# Patient Record
Sex: Female | Born: 1980
Health system: Southern US, Community
[De-identification: ages and names within clinical notes are randomized; demographics above are authoritative.]

## PROBLEM LIST (undated history)

## (undated) DIAGNOSIS — D649 Anemia, unspecified: Secondary | ICD-10-CM

## (undated) DIAGNOSIS — H35029 Exudative retinopathy, unspecified eye: Secondary | ICD-10-CM

## (undated) DIAGNOSIS — O24419 Gestational diabetes mellitus in pregnancy, unspecified control: Secondary | ICD-10-CM

## (undated) DIAGNOSIS — R87629 Unspecified abnormal cytological findings in specimens from vagina: Secondary | ICD-10-CM

## (undated) DIAGNOSIS — N159 Renal tubulo-interstitial disease, unspecified: Secondary | ICD-10-CM

## (undated) HISTORY — PX: EYE SURGERY: SHX253

## (undated) HISTORY — DX: Gestational diabetes mellitus in pregnancy, unspecified control: O24.419

## (undated) HISTORY — DX: Exudative retinopathy, unspecified eye: H35.029

---
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
## 2004-02-26 ENCOUNTER — Emergency Department (HOSPITAL_COMMUNITY): Admission: EM | Admit: 2004-02-26 | Discharge: 2004-02-26 | Payer: Self-pay | Admitting: Emergency Medicine

## 2004-07-17 ENCOUNTER — Ambulatory Visit (HOSPITAL_COMMUNITY): Admission: RE | Admit: 2004-07-17 | Discharge: 2004-07-17 | Payer: Self-pay | Admitting: Obstetrics & Gynecology

## 2004-08-04 ENCOUNTER — Inpatient Hospital Stay (HOSPITAL_COMMUNITY): Admission: AD | Admit: 2004-08-04 | Discharge: 2004-08-04 | Payer: Self-pay | Admitting: Obstetrics

## 2004-09-27 ENCOUNTER — Inpatient Hospital Stay (HOSPITAL_COMMUNITY): Admission: AD | Admit: 2004-09-27 | Discharge: 2004-09-27 | Payer: Self-pay | Admitting: Obstetrics

## 2004-10-30 ENCOUNTER — Inpatient Hospital Stay (HOSPITAL_COMMUNITY): Admission: AD | Admit: 2004-10-30 | Discharge: 2004-10-30 | Payer: Self-pay | Admitting: Obstetrics

## 2004-10-31 ENCOUNTER — Inpatient Hospital Stay (HOSPITAL_COMMUNITY): Admission: AD | Admit: 2004-10-31 | Discharge: 2004-10-31 | Payer: Self-pay | Admitting: Obstetrics

## 2004-11-02 ENCOUNTER — Inpatient Hospital Stay (HOSPITAL_COMMUNITY): Admission: AD | Admit: 2004-11-02 | Discharge: 2004-11-02 | Payer: Self-pay | Admitting: Obstetrics

## 2004-11-03 ENCOUNTER — Inpatient Hospital Stay (HOSPITAL_COMMUNITY): Admission: AD | Admit: 2004-11-03 | Discharge: 2004-11-05 | Payer: Self-pay | Admitting: Obstetrics

## 2004-11-11 ENCOUNTER — Inpatient Hospital Stay (HOSPITAL_COMMUNITY): Admission: AD | Admit: 2004-11-11 | Discharge: 2004-11-11 | Payer: Self-pay | Admitting: Obstetrics & Gynecology

## 2005-03-06 ENCOUNTER — Other Ambulatory Visit: Admission: RE | Admit: 2005-03-06 | Discharge: 2005-03-06 | Payer: Self-pay | Admitting: Family Medicine

## 2005-04-09 ENCOUNTER — Ambulatory Visit (HOSPITAL_COMMUNITY): Admission: RE | Admit: 2005-04-09 | Discharge: 2005-04-09 | Payer: Self-pay | Admitting: Chiropractic Medicine

## 2006-08-11 IMAGING — US US OB FOLLOW-UP
1 series · 13 of 27 positions shown · non-contrast
Comparison: none

CLINICAL DATA: 23-year-old female with 35 week 1 day gestation with pelvic pain, dyspnea.

[Series 1: us ob follow-up · 0.33mm/px · 13 of 27 slices shown]
[im 2/27]
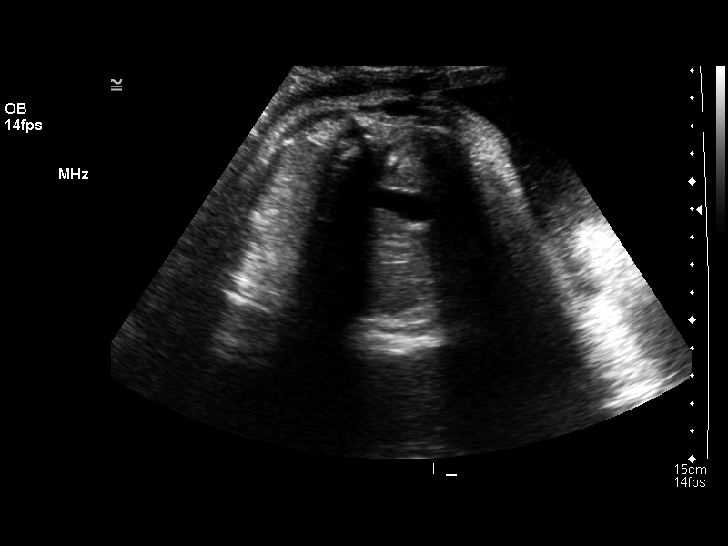
[im 4/27]
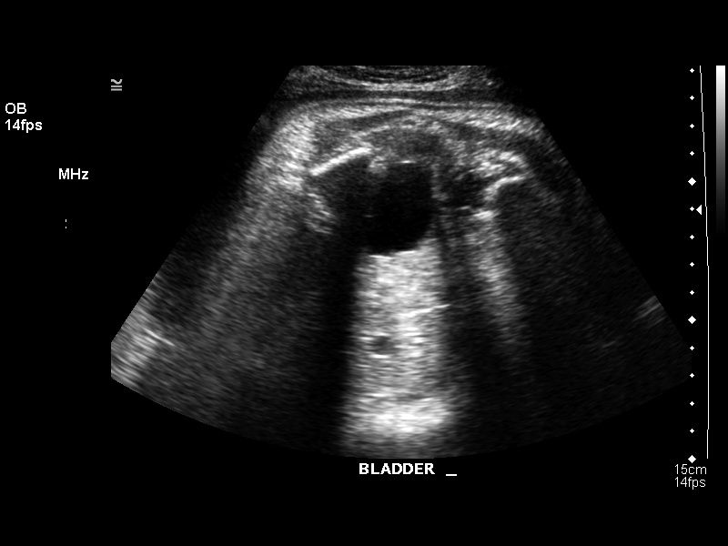
[im 6/27]
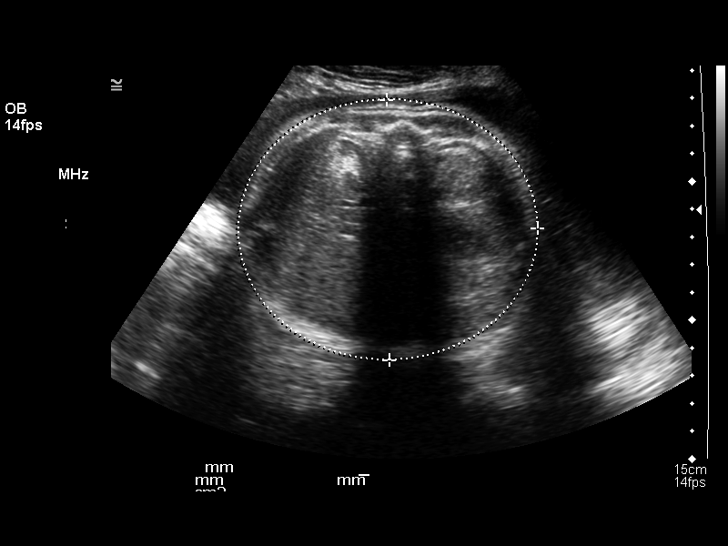
[im 8/27]
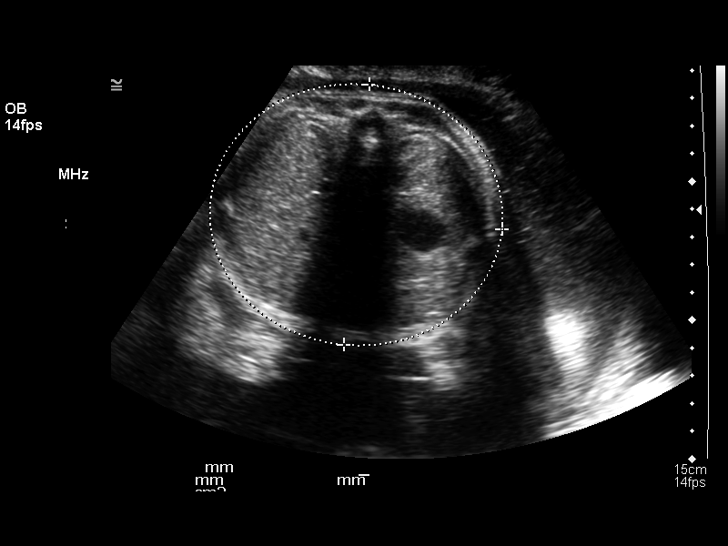
[im 10/27]
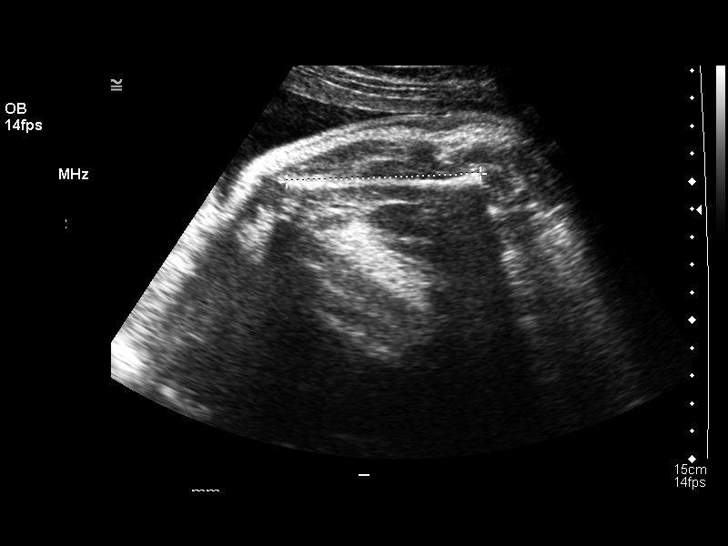
[im 12/27]
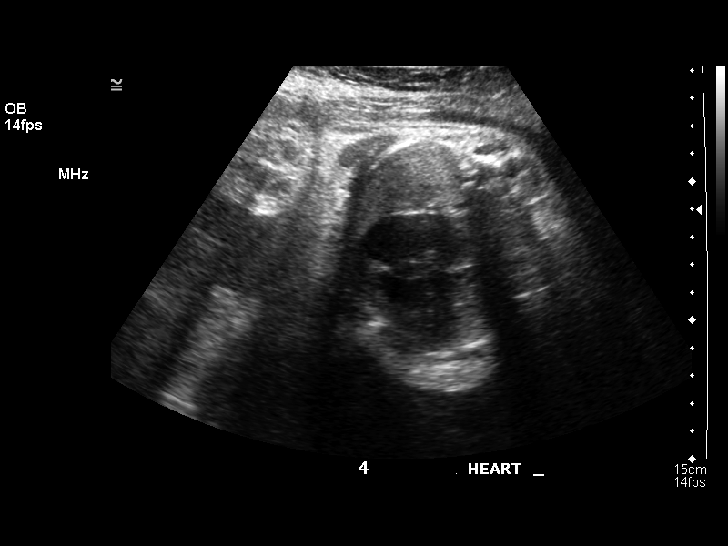
[im 14/27]
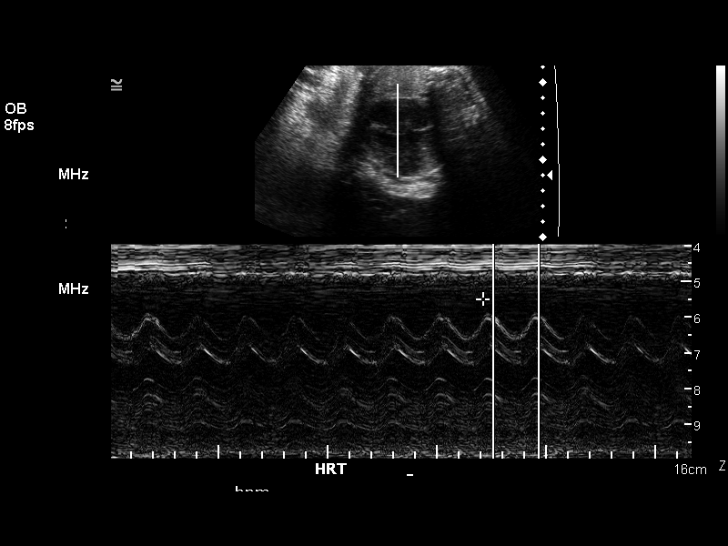
[im 16/27]
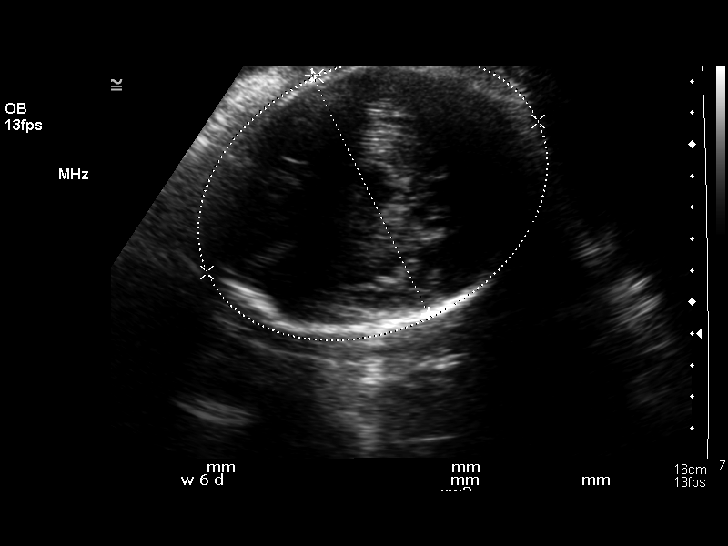
[im 18/27]
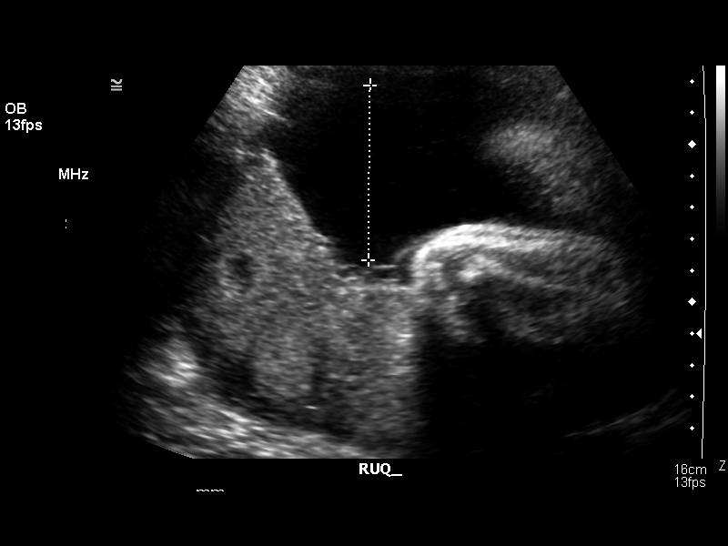
[im 20/27]
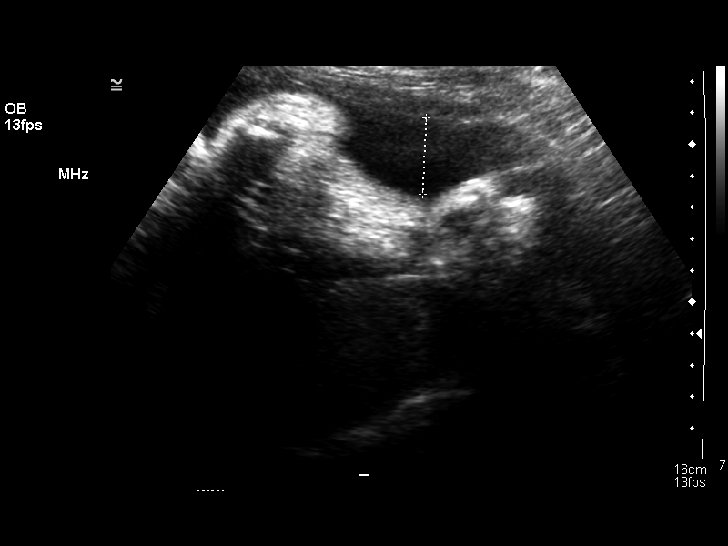
[im 22/27]
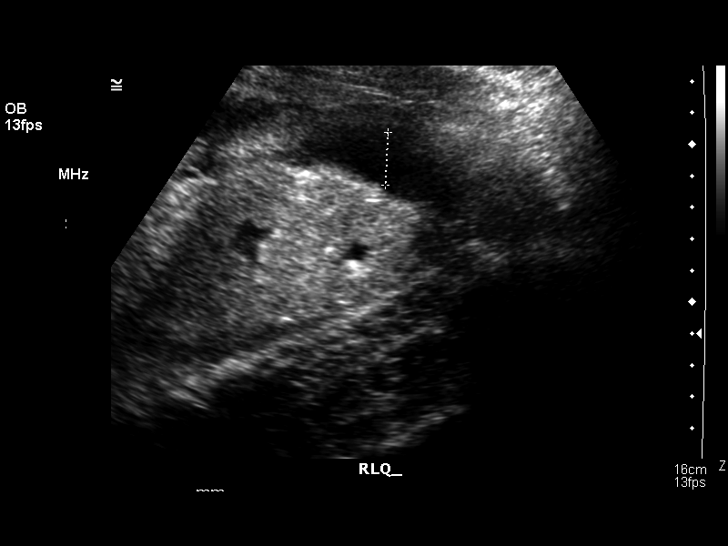
[im 24/27]
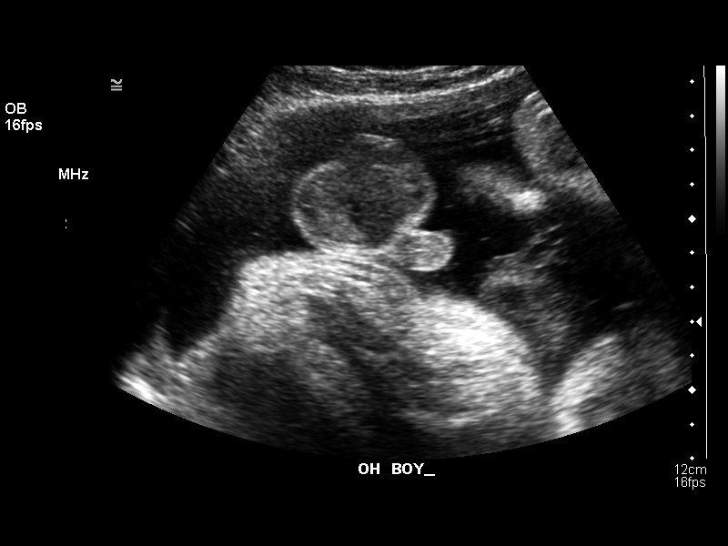
[im 26/27]
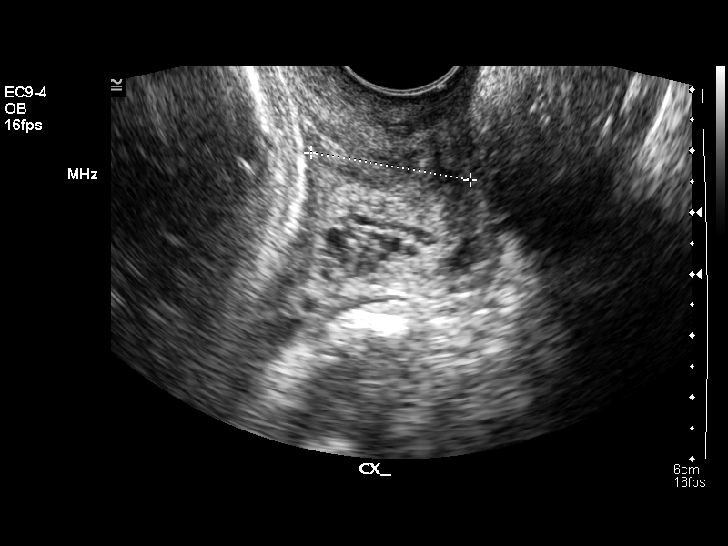

[13 of 27 positions shown; findings below may reference images not displayed]

OBSTETRICAL ULTRASOUND RE-EVALUATION WITH TRANSVAGINAL EVALUATION, 09/27/04:
 Number of Fetuses:  1
 Heart Rate:  144 bpm
 Movement:  Yes
 Breathing:  Yes
 Presentation:  Cephalic
 Placental Location:  Posterior
 Grade:  2
 Previa:  No
 Amniotic Fluid (subjective):  Normal
 Amniotic Fluid (objective):  AFI 11.8 cm (5th-45th %ile = 7.9 to 24.9 cm for 35 weeks) 

 FETAL BIOMETRY
 BPD:  8.4 cm, 34 W 0 D
 HC:  31.4 cm, 35 W 2 D
 AC:  31.5 cm, 35 W 3 D
 FL:  7.0 cm, 36 W 0 D

 Mean GA:  35 W 1 D
 Assigned GA:  34 W 6 D

 EFW:   9224 grams (H) 36th-93th %ile (4777 – 6560 g) for 35 weeks 

 FETAL ANATOMY
 Lateral Ventricles:    Previously seen 
 Thalami/CSP:    Previously seen 
 Posterior Fossa:    Previously seen 
 Nuchal Region:    Previously seen 
 Spine:    Previously seen 
 4 Chamber Heart on Left:  Visualized 
 Stomach on Left:  Visualized 
 3 Vessel Cord:  Visualized 
 Cord Insertion Site:  Previously seen   
 Kidneys:  Visualized 
 Bladder:  Visualized 
 Extremities:  Previously seen 

 Evaluation limited by: Fetal position and advanced gestational age.

 MATERNAL FINDINGS
 Cervix:  2.4 cm transvaginally.
IMPRESSION: 1.  Single living intrauterine gestation with both assigned and current ultrasound dating age of 35 weeks 1 day.  Cephalic presentation with normal amniotic fluid volume.
 2.  Fetal anatomy limited by fetal position and advanced gestational age.
 3.  Cervical length 2.4 cm transvaginally.

## 2007-07-08 ENCOUNTER — Emergency Department (HOSPITAL_COMMUNITY): Admission: EM | Admit: 2007-07-08 | Discharge: 2007-07-09 | Payer: Self-pay | Admitting: Emergency Medicine

## 2009-11-30 DIAGNOSIS — N159 Renal tubulo-interstitial disease, unspecified: Secondary | ICD-10-CM

## 2009-11-30 HISTORY — DX: Renal tubulo-interstitial disease, unspecified: N15.9

## 2010-07-10 ENCOUNTER — Emergency Department (HOSPITAL_COMMUNITY): Admission: EM | Admit: 2010-07-10 | Discharge: 2010-07-10 | Payer: Self-pay | Admitting: Emergency Medicine

## 2011-01-27 ENCOUNTER — Emergency Department (HOSPITAL_COMMUNITY)
Admission: EM | Admit: 2011-01-27 | Discharge: 2011-01-28 | Disposition: A | Discharge status: Home / Self Care | Payer: Self-pay | Attending: Emergency Medicine | Admitting: Emergency Medicine

## 2011-01-27 DIAGNOSIS — N12 Tubulo-interstitial nephritis, not specified as acute or chronic: Secondary | ICD-10-CM | POA: Insufficient documentation

## 2011-01-27 DIAGNOSIS — Z882 Allergy status to sulfonamides status: Secondary | ICD-10-CM | POA: Insufficient documentation

## 2011-01-27 DIAGNOSIS — Z880 Allergy status to penicillin: Secondary | ICD-10-CM | POA: Insufficient documentation

## 2011-01-27 LAB — URINALYSIS, ROUTINE W REFLEX MICROSCOPIC
Bilirubin Urine: NEGATIVE
Ketones, ur: NEGATIVE mg/dL
Nitrite: POSITIVE — AB
Protein, ur: 100 mg/dL — AB
Specific Gravity, Urine: 1.016 (ref 1.005–1.030)
Urine Glucose, Fasting: NEGATIVE mg/dL
Urobilinogen, UA: 1 mg/dL (ref 0.0–1.0)
pH: 7 (ref 5.0–8.0)

## 2011-01-27 LAB — URINE MICROSCOPIC-ADD ON

## 2011-01-27 LAB — CBC
HCT: 34.9 % — ABNORMAL LOW (ref 36.0–46.0)
Hemoglobin: 11.3 g/dL — ABNORMAL LOW (ref 12.0–15.0)
MCH: 28.2 pg (ref 26.0–34.0)
MCHC: 32.4 g/dL (ref 30.0–36.0)
MCV: 87 fL (ref 78.0–100.0)
Platelets: 242 10*3/uL (ref 150–400)
RBC: 4.01 MIL/uL (ref 3.87–5.11)
RDW: 13 % (ref 11.5–15.5)
WBC: 9.7 10*3/uL (ref 4.0–10.5)

## 2011-01-27 LAB — DIFFERENTIAL
Basophils Absolute: 0 10*3/uL (ref 0.0–0.1)
Basophils Relative: 0 % (ref 0–1)
Eosinophils Absolute: 0.1 10*3/uL (ref 0.0–0.7)
Eosinophils Relative: 1 % (ref 0–5)
Lymphocytes Relative: 15 % (ref 12–46)
Lymphs Abs: 1.4 10*3/uL (ref 0.7–4.0)
Monocytes Absolute: 1.3 10*3/uL — ABNORMAL HIGH (ref 0.1–1.0)
Monocytes Relative: 13 % — ABNORMAL HIGH (ref 3–12)
Neutro Abs: 6.9 10*3/uL (ref 1.7–7.7)
Neutrophils Relative %: 71 % (ref 43–77)

## 2011-01-27 LAB — PREGNANCY, URINE: Preg Test, Ur: NEGATIVE

## 2011-01-28 ENCOUNTER — Encounter (HOSPITAL_COMMUNITY): Payer: Self-pay

## 2011-01-28 ENCOUNTER — Emergency Department (HOSPITAL_COMMUNITY): Payer: Self-pay

## 2011-01-28 LAB — COMPREHENSIVE METABOLIC PANEL
ALT: 35 U/L (ref 0–35)
AST: 66 U/L — ABNORMAL HIGH (ref 0–37)
Albumin: 4 g/dL (ref 3.5–5.2)
Alkaline Phosphatase: 53 U/L (ref 39–117)
BUN: 8 mg/dL (ref 6–23)
CO2: 25 mEq/L (ref 19–32)
Calcium: 9.2 mg/dL (ref 8.4–10.5)
Chloride: 107 mEq/L (ref 96–112)
Creatinine, Ser: 0.82 mg/dL (ref 0.4–1.2)
GFR calc Af Amer: >60 mL/min (ref >60)
GFR calc non Af Amer: >60 mL/min (ref >60)
Glucose, Bld: 85 mg/dL (ref 70–99)
Potassium: 3.4 mEq/L — ABNORMAL LOW (ref 3.5–5.1)
Sodium: 139 mEq/L (ref 135–145)
Total Bilirubin: 0.8 mg/dL (ref 0.3–1.2)
Total Protein: 7.1 g/dL (ref 6.0–8.3)

## 2011-01-28 LAB — LIPASE, BLOOD: Lipase: 21 U/L (ref 11–59)

## 2011-01-29 LAB — URINE CULTURE
Colony Count: >=100000
Culture  Setup Time: 201202290349

## 2011-02-13 LAB — DIFFERENTIAL
Basophils Absolute: 0 10*3/uL (ref 0.0–0.1)
Basophils Relative: 1 % (ref 0–1)
Eosinophils Absolute: 0.1 10*3/uL (ref 0.0–0.7)
Eosinophils Relative: 1 % (ref 0–5)
Lymphocytes Relative: 7 % — ABNORMAL LOW (ref 12–46)
Lymphs Abs: 0.5 10*3/uL — ABNORMAL LOW (ref 0.7–4.0)
Monocytes Absolute: 0.8 10*3/uL (ref 0.1–1.0)
Monocytes Relative: 11 % (ref 3–12)
Neutro Abs: 5.7 10*3/uL (ref 1.7–7.7)
Neutrophils Relative %: 81 % — ABNORMAL HIGH (ref 43–77)

## 2011-02-13 LAB — URINALYSIS, ROUTINE W REFLEX MICROSCOPIC
Bilirubin Urine: NEGATIVE
Glucose, UA: NEGATIVE mg/dL
Hgb urine dipstick: NEGATIVE
Ketones, ur: NEGATIVE mg/dL
Nitrite: NEGATIVE
Protein, ur: NEGATIVE mg/dL
Specific Gravity, Urine: 1.021 (ref 1.005–1.030)
Urobilinogen, UA: 1 mg/dL (ref 0.0–1.0)
pH: 8 (ref 5.0–8.0)

## 2011-02-13 LAB — COMPREHENSIVE METABOLIC PANEL
ALT: 11 U/L (ref 0–35)
AST: 17 U/L (ref 0–37)
Albumin: 3.8 g/dL (ref 3.5–5.2)
Alkaline Phosphatase: 63 U/L (ref 39–117)
BUN: 11 mg/dL (ref 6–23)
CO2: 23 mEq/L (ref 19–32)
Calcium: 8.9 mg/dL (ref 8.4–10.5)
Chloride: 108 mEq/L (ref 96–112)
Creatinine, Ser: 0.75 mg/dL (ref 0.4–1.2)
GFR calc Af Amer: >60 mL/min (ref >60)
GFR calc non Af Amer: >60 mL/min (ref >60)
Glucose, Bld: 102 mg/dL — ABNORMAL HIGH (ref 70–99)
Potassium: 3.3 mEq/L — ABNORMAL LOW (ref 3.5–5.1)
Sodium: 135 mEq/L (ref 135–145)
Total Bilirubin: 0.3 mg/dL (ref 0.3–1.2)
Total Protein: 6.5 g/dL (ref 6.0–8.3)

## 2011-02-13 LAB — CBC
HCT: 32.9 % — ABNORMAL LOW (ref 36.0–46.0)
Hemoglobin: 11 g/dL — ABNORMAL LOW (ref 12.0–15.0)
MCH: 28.9 pg (ref 26.0–34.0)
MCHC: 33.3 g/dL (ref 30.0–36.0)
MCV: 86.7 fL (ref 78.0–100.0)
Platelets: 235 10*3/uL (ref 150–400)
RBC: 3.79 MIL/uL — ABNORMAL LOW (ref 3.87–5.11)
RDW: 14.6 % (ref 11.5–15.5)
WBC: 7 10*3/uL (ref 4.0–10.5)

## 2011-02-13 LAB — URINE CULTURE
Colony Count: 80000
Culture  Setup Time: 201108110842

## 2011-02-13 LAB — LIPASE, BLOOD: Lipase: 26 U/L (ref 11–59)

## 2011-02-13 LAB — URINE MICROSCOPIC-ADD ON

## 2011-02-13 LAB — POCT PREGNANCY, URINE: Preg Test, Ur: NEGATIVE

## 2011-03-29 ENCOUNTER — Inpatient Hospital Stay (HOSPITAL_COMMUNITY)
Admission: AD | Admit: 2011-03-29 | Discharge: 2011-03-29 | Disposition: A | Discharge status: Home / Self Care | Payer: Self-pay | Source: Ambulatory Visit | Attending: Obstetrics and Gynecology | Admitting: Obstetrics and Gynecology

## 2011-03-29 DIAGNOSIS — N938 Other specified abnormal uterine and vaginal bleeding: Secondary | ICD-10-CM | POA: Insufficient documentation

## 2011-03-29 DIAGNOSIS — N949 Unspecified condition associated with female genital organs and menstrual cycle: Secondary | ICD-10-CM

## 2011-03-29 DIAGNOSIS — N390 Urinary tract infection, site not specified: Secondary | ICD-10-CM | POA: Insufficient documentation

## 2011-03-29 LAB — URINALYSIS, ROUTINE W REFLEX MICROSCOPIC
Bilirubin Urine: NEGATIVE
Ketones, ur: NEGATIVE mg/dL
Nitrite: NEGATIVE
Specific Gravity, Urine: 1.025 (ref 1.005–1.030)
Urobilinogen, UA: 0.2 mg/dL (ref 0.0–1.0)

## 2011-03-29 LAB — WET PREP, GENITAL
Trich, Wet Prep: NONE SEEN
Yeast Wet Prep HPF POC: NONE SEEN

## 2011-03-29 LAB — URINE MICROSCOPIC-ADD ON

## 2011-03-29 LAB — HCG, QUANTITATIVE, PREGNANCY: hCG, Beta Chain, Quant, S: <2 m[IU]/mL (ref <5)

## 2012-02-04 ENCOUNTER — Emergency Department (HOSPITAL_BASED_OUTPATIENT_CLINIC_OR_DEPARTMENT_OTHER)
Admission: EM | Admit: 2012-02-04 | Discharge: 2012-02-04 | Disposition: A | Discharge status: Home Health | Payer: Self-pay | Attending: Emergency Medicine | Admitting: Emergency Medicine

## 2012-02-04 ENCOUNTER — Encounter (HOSPITAL_BASED_OUTPATIENT_CLINIC_OR_DEPARTMENT_OTHER): Payer: Self-pay | Admitting: *Deleted

## 2012-02-04 ENCOUNTER — Emergency Department (INDEPENDENT_AMBULATORY_CARE_PROVIDER_SITE_OTHER): Payer: Self-pay

## 2012-02-04 DIAGNOSIS — S161XXA Strain of muscle, fascia and tendon at neck level, initial encounter: Secondary | ICD-10-CM

## 2012-02-04 DIAGNOSIS — M542 Cervicalgia: Secondary | ICD-10-CM | POA: Insufficient documentation

## 2012-02-04 DIAGNOSIS — Z79899 Other long term (current) drug therapy: Secondary | ICD-10-CM | POA: Insufficient documentation

## 2012-02-04 DIAGNOSIS — M549 Dorsalgia, unspecified: Secondary | ICD-10-CM

## 2012-02-04 DIAGNOSIS — S139XXA Sprain of joints and ligaments of unspecified parts of neck, initial encounter: Secondary | ICD-10-CM | POA: Insufficient documentation

## 2012-02-04 DIAGNOSIS — M25519 Pain in unspecified shoulder: Secondary | ICD-10-CM

## 2012-02-04 DIAGNOSIS — X500XXA Overexertion from strenuous movement or load, initial encounter: Secondary | ICD-10-CM | POA: Insufficient documentation

## 2012-02-04 HISTORY — DX: Renal tubulo-interstitial disease, unspecified: N15.9

## 2012-02-04 HISTORY — DX: Anemia, unspecified: D64.9

## 2012-02-04 MED ORDER — IBUPROFEN 600 MG PO TABS: 600.0000 mg | ORAL_TABLET | Freq: Four times a day (QID) | ORAL | Status: AC | PRN

## 2012-02-04 MED ORDER — HYDROCODONE-ACETAMINOPHEN 5-325 MG PO TABS
1.0000 | ORAL_TABLET | Freq: Once | ORAL | Status: AC
  Administered 2012-02-04: 1 via ORAL
  Filled 2012-02-04: qty 1

## 2012-02-04 MED ORDER — IBUPROFEN 400 MG PO TABS
600.0000 mg | ORAL_TABLET | Freq: Once | ORAL | Status: AC
  Administered 2012-02-04: 600 mg via ORAL
  Filled 2012-02-04: qty 1

## 2012-02-04 MED ORDER — HYDROCODONE-ACETAMINOPHEN 5-500 MG PO TABS: 1.0000 | ORAL_TABLET | Freq: Four times a day (QID) | ORAL | Status: AC | PRN

## 2012-02-04 NOTE — Discharge Instructions (Signed)
Avoid heavy lifting more than 20 lbs for the next 3-4 days. May try heat/heating pad to sore area. May also try gently massage. Take motrin as need for pain. You may also take vicodin as need for pain. No driving for the next 6 hours or when taking vicodin. Also, do not take tylenol or acetaminophen containing medication when taking vicodin.  Follow up with primary care doctor in 1 week if symptoms fail to improve/resolve.    Return to ER iif worse, worsening/severe pain, arm numbness/weakess, other concern.       Cervical Strain Care After A cervical strain is when the muscles and ligaments in your neck have been stretched. The bones are not broken. If you had any problems moving your arms or legs immediately after the injury, even if the problem has gone away, make sure to tell this to your caregiver.  HOME CARE INSTRUCTIONS   While awake, apply ice packs to the neck or areas of pain about every 1 to 2 hours, for 15 to 20 minutes at a time. Do this for 2 days. If you were given a cervical collar for support, ask your caregiver if you may remove it for bathing or applying ice.   If given a cervical collar, wear as instructed. Do not remove any collar unless instructed by a caregiver.   Only take over-the-counter or prescription medicines for pain, discomfort, or fever as directed by your caregiver.  Recheck with the hospital or clinic after a radiologist has read your X-rays. Recheck with the hospital or clinic to make sure the initial readings are correct. Do this also to determine if you need further studies. It is your responsibility to find out your X-ray results. X-rays are sometimes repeated in one week to ten days. These are often repeated to make sure that a hairline fracture was not overlooked. Ask your caregiver how you are to find out about your radiology (X-ray) results. SEEK IMMEDIATE MEDICAL CARE IF:   You have increasing pain in your neck.   You develop difficulties  swallowing or breathing.   You have numbness, weakness, or movement problems in the arms or legs.   You have difficulty walking.   You develop bowel or bladder retention or incontinence.   You have problems with walking.  MAKE SURE YOU:   Understand these instructions.   Will watch your condition.   Will get help right away if you are not doing well or get worse.  Document Released: 11/16/2005 Document Revised: 07/29/2011 Document Reviewed: 06/29/2008 [facility] Patient Information 2012 [place], [place].      Cervical Sprain A cervical sprain is an injury in the neck in which the ligaments are stretched or torn. The ligaments are the tissues that hold the bones of the neck (vertebrae) in place.Cervical sprains can range from very mild to very severe. Most cervical sprains get better in 1 to 3 weeks, but it depends on the cause and extent of the injury. Severe cervical sprains can cause the neck vertebrae to be unstable. This can lead to damage of the spinal cord and can result in serious nervous system problems. Your caregiver will determine whether your cervical sprain is mild or severe. CAUSES  Severe cervical sprains may be caused by:  Contact sport injuries (football, rugby, wrestling, hockey, auto racing, gymnastics, diving, martial arts, boxing).   Motor vehicle collisions.   Whiplash injuries. This means the neck is forcefully whipped backward and forward.   Falls.  Mild cervical sprains may  be caused by:   Awkward positions, such as cradling a telephone between your ear and shoulder.   Sitting in a chair that does not offer proper support.   Working at a poorly Marketing executive station.   Activities that require looking up or down for long periods of time.  SYMPTOMS   Pain, soreness, stiffness, or a burning sensation in the front, back, or sides of the neck. This discomfort may develop immediately after injury or it may develop slowly and not begin for 24 hours or  more after an injury.   Pain or tenderness directly in the middle of the back of the neck.   Shoulder or upper back pain.   Limited ability to move the neck.   Headache.   Dizziness.   Weakness, numbness, or tingling in the hands or arms.   Muscle spasms.   Difficulty swallowing or chewing.   Tenderness and swelling of the neck.  DIAGNOSIS  Most of the time, your caregiver can diagnose this problem by taking your history and doing a physical exam. Your caregiver will ask about any known problems, such as arthritis in the neck or a previous neck injury. X-rays may be taken to find out if there are any other problems, such as problems with the bones of the neck. However, an X-ray often does not reveal the full extent of a cervical sprain. Other tests such as a computed tomography (CT) scan or magnetic resonance imaging (MRI) may be needed. TREATMENT  Treatment depends on the severity of the cervical sprain. Mild sprains can be treated with rest, keeping the neck in place (immobilization), and pain medicines. Severe cervical sprains need immediate immobilization and an appointment with an orthopedist or neurosurgeon. Several treatment options are available to help with pain, muscle spasms, and other symptoms. Your caregiver may prescribe:  Medicines, such as pain relievers, numbing medicines, or muscle relaxants.   Physical therapy. This can include stretching exercises, strengthening exercises, and posture training. Exercises and improved posture can help stabilize the neck, strengthen muscles, and help stop symptoms from returning.   A neck collar to be worn for short periods of time. Often, these collars are worn for comfort. However, certain collars may be worn to protect the neck and prevent further worsening of a serious cervical sprain.  HOME CARE INSTRUCTIONS   Put ice on the injured area.   Put ice in a plastic bag.   Place a towel between your skin and the bag.   Leave the  ice on for 15 to 20 minutes, 3 to 4 times a day.   Only take over-the-counter or prescription medicines for pain, discomfort, or fever as directed by your caregiver.   Keep all follow-up appointments as directed by your caregiver.   Keep all physical therapy appointments as directed by your caregiver.   If a neck collar is prescribed, wear it as directed by your caregiver.   Do not drive while wearing a neck collar.   Make any needed adjustments to your work station to promote good posture.   Avoid positions and activities that make your symptoms worse.   Warm up and stretch before being active to help prevent problems.  SEEK MEDICAL CARE IF:   Your pain is not controlled with medicine.   You are unable to decrease your pain medicine over time as planned.   Your activity level is not improving as expected.  SEEK IMMEDIATE MEDICAL CARE IF:   You develop any bleeding, stomach upset,  or signs of an allergic reaction to your medicine.   Your symptoms get worse.   You develop new, unexplained symptoms.   You have numbness, tingling, weakness, or paralysis in any part of your body.  MAKE SURE YOU:   Understand these instructions.   Will watch your condition.   Will get help right away if you are not doing well or get worse.  Document Released: 09/13/2007 Document Revised: 11/05/2011 Document Reviewed: 08/19/2011 [facility] Patient Information 2012 [place], [place].

## 2012-02-04 NOTE — ED Provider Notes (Signed)
History     CSN: [id number]  Arrival date & time 02/04/12  1805   First MD Initiated Contact with Patient 02/04/12 1845      Chief Complaint  Patient presents with  . Neck Pain    shoulders and upper back    (Consider location/radiation/quality/duration/timing/severity/associated sxs/prior treatment) Patient is a 31 y.o. female presenting with neck pain. The history is provided by the patient.  Neck Pain  Pertinent negatives include no chest pain, no numbness, no headaches and no weakness.  pt c/o neck pain s/p lifting injury 5 days ago. Constant, dull, neck and upper back pain. Occasionally radiates towards left arm. No numbness/weakness. No hx ddd. No fever/chills. No headache. No mid to lower back pain. Denies other injury. Worse w palpation, turning neck. Pt states injury occurred at work when lifting up pt from behind.   Past Medical History  Diagnosis Date  . Kidney infection 2011  . Anemia     Past Surgical History  Procedure Date  . Eye surgery     No family history on file.  History  Substance Use Topics  . Smoking status: Never Smoker   . Smokeless tobacco: Not on file  . Alcohol Use: No    OB History    Grav Para Term Preterm Abortions TAB SAB Ect Mult Living                  Review of Systems  Constitutional: Negative for fever and chills.  HENT: Positive for neck pain. Negative for sore throat.   Respiratory: Negative for shortness of breath.   Cardiovascular: Negative for chest pain.  Gastrointestinal: Negative for vomiting.  Genitourinary: Negative for flank pain.  Skin: Negative for rash.  Neurological: Negative for weakness, numbness and headaches.  Hematological: Does not bruise/bleed easily.  Psychiatric/Behavioral: Negative for confusion.    Allergies  Amoxicillin  Home Medications   Current Outpatient Rx  Name Route Sig Dispense Refill  . FERROUS FUMARATE 325 (106 FE) MG PO TABS Oral Take 1 tablet by mouth.    Marland Kitchen GINKGO 60 MG PO  TABS Oral Take 60 mg by mouth daily.     Marland Kitchen MEDROXYPROGESTERONE ACETATE 150 MG/ML IM SUSP Intramuscular Inject 150 mg into the muscle every 3 (three) months.      BP 123/72  Pulse 82  Temp(Src) 99 F (37.2 C) (Oral)  Resp 20  Ht 5\' 5"  (1.651 m)  Wt 163 lb (73.936 kg)  BMI 27.12 kg/m2  SpO2 100%  Physical Exam  Nursing note and vitals reviewed. Constitutional: She is oriented to person, place, and time. She appears well-developed and well-nourished. No distress.  HENT:  Head: Atraumatic.  Eyes: Conjunctivae are normal. No scleral icterus.  Neck: Normal range of motion. Neck supple. No tracheal deviation present.       No rigidity  Cardiovascular: Normal rate.   Pulmonary/Chest: Effort normal. No respiratory distress.  Abdominal: Normal appearance. She exhibits no distension.  Musculoskeletal: She exhibits no edema.       c spine tender, aligned, no step off. Remainder spine exam non tender. bil trap muscle tenderness.   Neurological: She is alert and oriented to person, place, and time.       Motor intact bil. Steady gait  Skin: Skin is warm and dry. No rash noted.  Psychiatric: She has a normal mood and affect.    ED Course  Procedures (including critical care time)     MDM  Pt has ride, did not  drive. No meds pta. vicodin 1 po. Motrin po. Berlin Hun, MD 02/07/12 0730

## 2012-02-04 NOTE — ED Notes (Signed)
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  Approximately 2 hours later developed severe neck, bilateral shoulder and upper back pain.  Patient states she bought a soft neck brace on Monday and has been wearing since.

## 2013-09-19 LAB — OB RESULTS CONSOLE HEPATITIS B SURFACE ANTIGEN: Hepatitis B Surface Ag: NEGATIVE

## 2013-09-19 LAB — OB RESULTS CONSOLE ABO/RH: RH TYPE: POSITIVE

## 2013-09-19 LAB — OB RESULTS CONSOLE ANTIBODY SCREEN: Antibody Screen: NEGATIVE

## 2013-09-19 LAB — OB RESULTS CONSOLE RUBELLA ANTIBODY, IGM: Rubella: IMMUNE

## 2013-09-19 LAB — OB RESULTS CONSOLE GC/CHLAMYDIA
Chlamydia: NEGATIVE
Gonorrhea: NEGATIVE

## 2013-09-19 LAB — OB RESULTS CONSOLE RPR: RPR: NONREACTIVE

## 2013-09-19 LAB — OB RESULTS CONSOLE HIV ANTIBODY (ROUTINE TESTING): HIV: NONREACTIVE

## 2013-11-30 NOTE — L&D Delivery Note (Signed)
Delivery Note At 6:36 AM a viable and healthy female was delivered via  (Presentation: Right Occiput; Anterior ).  APGAR: 9, 9; weight pending.   Placenta status: Spontaneous, Intact.  Cord: 3V   Anesthesia:  Epidural Episiotomy: None Lacerations: None Suture Repair: NA Est. Blood Loss (mL): 50 cc  Mom to postpartum.  Baby to Couplet care / Skin to Skin.  Yvette Moore. Yvette Moore 03/20/2014, 7:00 AM

## 2014-02-28 LAB — OB RESULTS CONSOLE GBS: GBS: NEGATIVE

## 2014-03-09 ENCOUNTER — Emergency Department (HOSPITAL_BASED_OUTPATIENT_CLINIC_OR_DEPARTMENT_OTHER)
Admission: EM | Admit: 2014-03-09 | Discharge: 2014-03-09 | Disposition: A | Discharge status: Home / Self Care | Payer: 59 | Attending: Emergency Medicine | Admitting: Emergency Medicine

## 2014-03-09 ENCOUNTER — Encounter (HOSPITAL_BASED_OUTPATIENT_CLINIC_OR_DEPARTMENT_OTHER): Payer: Self-pay | Admitting: Emergency Medicine

## 2014-03-09 DIAGNOSIS — Z880 Allergy status to penicillin: Secondary | ICD-10-CM | POA: Insufficient documentation

## 2014-03-09 DIAGNOSIS — Y93E1 Activity, personal bathing and showering: Secondary | ICD-10-CM | POA: Insufficient documentation

## 2014-03-09 DIAGNOSIS — Z79899 Other long term (current) drug therapy: Secondary | ICD-10-CM | POA: Insufficient documentation

## 2014-03-09 DIAGNOSIS — T148XXA Other injury of unspecified body region, initial encounter: Secondary | ICD-10-CM

## 2014-03-09 DIAGNOSIS — W010XXA Fall on same level from slipping, tripping and stumbling without subsequent striking against object, initial encounter: Secondary | ICD-10-CM | POA: Insufficient documentation

## 2014-03-09 DIAGNOSIS — S338XXA Sprain of other parts of lumbar spine and pelvis, initial encounter: Secondary | ICD-10-CM | POA: Insufficient documentation

## 2014-03-09 DIAGNOSIS — Y929 Unspecified place or not applicable: Secondary | ICD-10-CM | POA: Insufficient documentation

## 2014-03-09 DIAGNOSIS — O9989 Other specified diseases and conditions complicating pregnancy, childbirth and the puerperium: Secondary | ICD-10-CM | POA: Insufficient documentation

## 2014-03-09 LAB — URINALYSIS, ROUTINE W REFLEX MICROSCOPIC
BILIRUBIN URINE: NEGATIVE
Glucose, UA: 100 mg/dL — AB
Hgb urine dipstick: NEGATIVE
Ketones, ur: NEGATIVE mg/dL
NITRITE: NEGATIVE
PH: 7 (ref 5.0–8.0)
Protein, ur: NEGATIVE mg/dL
SPECIFIC GRAVITY, URINE: 1.011 (ref 1.005–1.030)
UROBILINOGEN UA: 1 mg/dL (ref 0.0–1.0)

## 2014-03-09 LAB — URINE MICROSCOPIC-ADD ON: RBC / HPF: NONE SEEN RBC/hpf (ref <3)

## 2014-03-09 NOTE — ED Notes (Signed)
Pt to room 14 in w/c. Pt reports slipping on wet floor while getting out of the shower and "jeked my legs in the wrong direction..." pt denies actual fall to ground, reports pain in her  Pelvic area, "it was like two wooden chopsticks being pulled apart!" pt states baby is moving, and has been more active since then, denies any cramping, bleeding, or discharge. Pt states "it just hurts to move..." pt assisted into gown and into stretcher x 3 max asssit.

## 2014-03-09 NOTE — ED Notes (Signed)
Junie Panning, rn, birthing suites, calls to ask pt to be turned onto her side (either side) and given cold water to drink. Pt drinks 140cc ice water and turns to her right side x 2 max assist, assisted to maintain position with pillows.

## 2014-03-09 NOTE — Progress Notes (Signed)
0958  Call to [facility] RN, Amy to notify of reactive FHR with irregular contractions.  Patient will be discharged home with instructions to see OB as scheduled and to call OB with any further problems.  Patient will be instructed to call OB if contractions become stronger of more frequent.

## 2014-03-09 NOTE — ED Notes (Signed)
Pt entered by erin, rn birthing suites.

## 2014-03-09 NOTE — Discharge Instructions (Signed)

## 2014-03-09 NOTE — ED Notes (Signed)
Ob rapid response nurse Lynd calls to inform this rn that pt and baby are stable and ok for discharge from ob standpoint. Dr. Karle Starch informed.

## 2014-03-09 NOTE — ED Notes (Signed)
Pt placed on toco and fetal monitor, fhr 143, birthing suite nurse notified by t. Masten, rn.

## 2014-03-09 NOTE — Progress Notes (Signed)
0910 Received call regarding this 33 yo G1 P0 @ 37.[redacted]wks GA with report of near fall yesterday.  Reported slipping on the way out of the shower, almost doing a split.

## 2014-03-09 NOTE — Progress Notes (Signed)
[facility] ED RN, Amy asked to reposition patient to side lying.

## 2014-03-09 NOTE — ED Provider Notes (Signed)
CSN: [id number]     Arrival date & time 03/09/14  8182 History   First MD Initiated Contact with Patient 03/09/14 0840     Chief Complaint  Patient presents with  . Fall  . 38 weeks preg      (Consider location/radiation/quality/duration/timing/severity/associated sxs/prior Treatment) Patient is a 33 y.o. female presenting with fall.  Fall   Pt is G2P1 at approx [redacted]wks gestation with a normal pregnancy so far reports she slipped in the shower yesterday doing 'a split' but not actually falling or striking her abdomen. She has had moderate to severe aching pelvic pain since then. No vaginal bleeding, leaking or cramping. She has had similar but less severe pain throughout the pregnancy. Did not call Ob about the fall.  Past Medical History  Diagnosis Date  . Kidney infection 2011  . Anemia    Past Surgical History  Procedure Laterality Date  . Eye surgery     History reviewed. No pertinent family history. History  Substance Use Topics  . Smoking status: Never Smoker   . Smokeless tobacco: Not on file  . Alcohol Use: No   OB History   Grav Para Term Preterm Abortions TAB SAB Ect Mult Living   1              Review of Systems All other systems reviewed and are negative except as noted in HPI.     Allergies  Amoxicillin  Home Medications   Current Outpatient Rx  Name  Route  Sig  Dispense  Refill  . Prenatal Vit-Fe Fumarate-FA (MULTIVITAMIN-PRENATAL) 27-0.8 MG TABS tablet   Oral   Take 1 tablet by mouth daily at 12 noon.         . ferrous fumarate (HEMOCYTE - 106 MG FE) 325 (106 FE) MG TABS   Oral   Take 1 tablet by mouth.         . Ginkgo 60 MG TABS   Oral   Take 60 mg by mouth daily.          . medroxyPROGESTERone (DEPO-PROVERA) 150 MG/ML injection   Intramuscular   Inject 150 mg into the muscle every 3 (three) months.          BP 126/73  Pulse 93  Temp(Src) 97.9 F (36.6 C) (Oral)  Resp 18  SpO2 99% Physical Exam  Nursing note and vitals  reviewed. Constitutional: She is oriented to person, place, and time. She appears well-developed and well-nourished.  HENT:  Head: Normocephalic and atraumatic.  Eyes: EOM are normal. Pupils are equal, round, and reactive to light.  Neck: Normal range of motion. Neck supple.  Cardiovascular: Normal rate, normal heart sounds and intact distal pulses.   Pulmonary/Chest: Effort normal and breath sounds normal.  Abdominal: Bowel sounds are normal. She exhibits no distension. There is no tenderness. There is no rebound and no guarding.  Gravid, consistent with dates  Musculoskeletal: Normal range of motion. She exhibits no edema and no tenderness.  Neurological: She is alert and oriented to person, place, and time. She has normal strength. No cranial nerve deficit or sensory deficit.  Skin: Skin is warm and dry. No rash noted.  Psychiatric: She has a normal mood and affect.    ED Course  Procedures (including critical care time) Labs Review Labs Reviewed  URINALYSIS, ROUTINE W REFLEX MICROSCOPIC   Imaging Review No results found.   EKG Interpretation None      MDM   Final diagnoses:  Muscle strain  Discussed with Dr. Rogue Bussing on call for patient's Ob Dr. Ouida Sills. She states typically needs monitoring 4-8hrs post injury, but given remote timing of this fall, a normal, reactive NST of 20-1min should be adequate. Will place patient on our fetal monitor.   10:57 AM Rhythm strip evaluated by staff at [facility], no concerning findings. Pt cleared for discharge. Advised APAP and rest for likely muscle strain from slipping on wet floor. Followup with Ob.   Ragan Reale B. Karle Starch, MD 03/09/14 1058

## 2014-03-10 LAB — URINE CULTURE: Colony Count: 50000

## 2014-03-18 ENCOUNTER — Inpatient Hospital Stay (HOSPITAL_COMMUNITY)
Admission: AD | Admit: 2014-03-18 | Discharge: 2014-03-18 | Disposition: A | Discharge status: Home / Self Care | Payer: 59 | Source: Ambulatory Visit | Attending: Obstetrics & Gynecology | Admitting: Obstetrics & Gynecology

## 2014-03-18 DIAGNOSIS — O9989 Other specified diseases and conditions complicating pregnancy, childbirth and the puerperium: Principal | ICD-10-CM

## 2014-03-18 DIAGNOSIS — O99891 Other specified diseases and conditions complicating pregnancy: Secondary | ICD-10-CM | POA: Insufficient documentation

## 2014-03-18 LAB — POCT FERN TEST: POCT Fern Test: NEGATIVE

## 2014-03-18 LAB — AMNISURE RUPTURE OF MEMBRANE (ROM) NOT AT ARMC: AMNISURE: NEGATIVE

## 2014-03-18 NOTE — MAU Provider Note (Signed)
HPI:  Ms. Yvette Moore is a 33 y.o. female G1P0 at [redacted]w[redacted]d who presents to MAU for possible ROM. At 0630 the patient felt a gush of fluid that leaked down her leg; at the time she was sleeping. The patient did not have a pad on upon arrival to MAU and has not continued to feel the fluid leaking. She reports good fetal movement, denies vaginal bleeding, vaginal itching/burning, urinary symptoms, h/a, dizziness, n/v, or fever/chills.    Objective:  GENERAL: Well-developed, well-nourished female in no acute distress.  HEENT: Normocephalic, atraumatic.   LUNGS: Effort normal HEART: Regular rate  SKIN: Warm, dry and without erythema PSYCH: Normal mood and affect  Filed Vitals:   03/18/14 0752  BP: 121/70  Pulse: 100  Temp: 97.7 F (36.5 C)  Resp: 18   Speculum exam: Vagina - Small amount of white, thick discharge, no odor. No pooling of fluid  Cervix - No contact bleeding, no active bleeding  Chaperone present for exam.  Fern negative  RN to consult with Dr. Alwyn Pea.  Negative pooling, negative fern slide  Darrelyn Hillock Theodus Ran, NP 03/18/2014 4:14 PM

## 2014-03-18 NOTE — MAU Note (Signed)
Pt presents with complaints of leakage of fluid since 6 this morning. She states the fluid is clear, denies vaginal bleeding.

## 2014-03-19 NOTE — MAU Provider Note (Signed)
Reviewed case with NP and I agree with above  Yvette Moore  

## 2014-03-20 ENCOUNTER — Inpatient Hospital Stay (HOSPITAL_COMMUNITY)
Admission: AD | Admit: 2014-03-20 | Discharge: 2014-03-21 | DRG: 775 | Disposition: A | Discharge status: Home / Self Care | Payer: 59 | Source: Ambulatory Visit | Attending: Obstetrics and Gynecology | Admitting: Obstetrics and Gynecology

## 2014-03-20 ENCOUNTER — Inpatient Hospital Stay (HOSPITAL_COMMUNITY): Payer: 59 | Admitting: Anesthesiology

## 2014-03-20 ENCOUNTER — Encounter (HOSPITAL_COMMUNITY): Payer: Self-pay | Admitting: *Deleted

## 2014-03-20 ENCOUNTER — Encounter (HOSPITAL_COMMUNITY): Payer: 59 | Admitting: Anesthesiology

## 2014-03-20 DIAGNOSIS — IMO0001 Reserved for inherently not codable concepts without codable children: Secondary | ICD-10-CM

## 2014-03-20 DIAGNOSIS — E669 Obesity, unspecified: Principal | ICD-10-CM | POA: Diagnosis present

## 2014-03-20 DIAGNOSIS — Z6841 Body Mass Index (BMI) 40.0 and over, adult: Secondary | ICD-10-CM

## 2014-03-20 DIAGNOSIS — O99214 Obesity complicating childbirth: Principal | ICD-10-CM

## 2014-03-20 DIAGNOSIS — Z87440 Personal history of urinary (tract) infections: Secondary | ICD-10-CM

## 2014-03-20 HISTORY — DX: Unspecified abnormal cytological findings in specimens from vagina: R87.629

## 2014-03-20 LAB — CBC
HCT: 38.9 % (ref 36.0–46.0)
Hemoglobin: 12.7 g/dL (ref 12.0–15.0)
MCH: 26.8 pg (ref 26.0–34.0)
MCHC: 32.6 g/dL (ref 30.0–36.0)
MCV: 82.1 fL (ref 78.0–100.0)
Platelets: 172 10*3/uL (ref 150–400)
RBC: 4.74 MIL/uL (ref 3.87–5.11)
RDW: 18.5 % — AB (ref 11.5–15.5)
WBC: 10.5 10*3/uL (ref 4.0–10.5)

## 2014-03-20 LAB — ABO/RH: ABO/RH(D): B POS

## 2014-03-20 LAB — TYPE AND SCREEN
ABO/RH(D): B POS
Antibody Screen: NEGATIVE

## 2014-03-20 LAB — POCT FERN TEST: POCT Fern Test: POSITIVE

## 2014-03-20 LAB — RPR

## 2014-03-20 MED ORDER — OXYCODONE-ACETAMINOPHEN 5-325 MG PO TABS
1.0000 | ORAL_TABLET | ORAL | Status: DC | PRN
  Administered 2014-03-20 – 2014-03-21 (×3): 1 via ORAL
  Filled 2014-03-20 (×4): qty 1

## 2014-03-20 MED ORDER — PRENATAL MULTIVITAMIN CH
1.0000 | ORAL_TABLET | Freq: Every day | ORAL | Status: DC
  Administered 2014-03-20 – 2014-03-21 (×2): 1 via ORAL
  Filled 2014-03-20 (×2): qty 1

## 2014-03-20 MED ORDER — DIPHENHYDRAMINE HCL 50 MG/ML IJ SOLN: 12.5000 mg | INTRAMUSCULAR | Status: DC | PRN

## 2014-03-20 MED ORDER — TETANUS-DIPHTH-ACELL PERTUSSIS 5-2.5-18.5 LF-MCG/0.5 IM SUSP
0.5000 mL | Freq: Once | INTRAMUSCULAR | Status: AC
  Administered 2014-03-21: 0.5 mL via INTRAMUSCULAR
  Filled 2014-03-20: qty 0.5

## 2014-03-20 MED ORDER — ZOLPIDEM TARTRATE 5 MG PO TABS: 5.0000 mg | ORAL_TABLET | Freq: Every evening | ORAL | Status: DC | PRN

## 2014-03-20 MED ORDER — SODIUM BICARBONATE 8.4 % IV SOLN
INTRAVENOUS | Status: DC | PRN
  Administered 2014-03-20: 5 mL via EPIDURAL

## 2014-03-20 MED ORDER — BENZOCAINE-MENTHOL 20-0.5 % EX AERO
1.0000 "application " | INHALATION_SPRAY | CUTANEOUS | Status: DC | PRN
  Administered 2014-03-20: 1 via TOPICAL
  Filled 2014-03-20: qty 56

## 2014-03-20 MED ORDER — ONDANSETRON HCL 4 MG/2ML IJ SOLN: 4.0000 mg | Freq: Four times a day (QID) | INTRAMUSCULAR | Status: DC | PRN

## 2014-03-20 MED ORDER — OXYTOCIN BOLUS FROM INFUSION
500.0000 mL | INTRAVENOUS | Status: DC
  Administered 2014-03-20: 500 mL via INTRAVENOUS

## 2014-03-20 MED ORDER — PHENYLEPHRINE 40 MCG/ML (10ML) SYRINGE FOR IV PUSH (FOR BLOOD PRESSURE SUPPORT)
80.0000 ug | PREFILLED_SYRINGE | INTRAVENOUS | Status: DC | PRN
  Filled 2014-03-20: qty 2

## 2014-03-20 MED ORDER — LIDOCAINE HCL (PF) 1 % IJ SOLN
30.0000 mL | INTRAMUSCULAR | Status: DC | PRN
  Filled 2014-03-20: qty 30

## 2014-03-20 MED ORDER — DIBUCAINE 1 % RE OINT: 1.0000 "application " | TOPICAL_OINTMENT | RECTAL | Status: DC | PRN

## 2014-03-20 MED ORDER — SENNOSIDES-DOCUSATE SODIUM 8.6-50 MG PO TABS
2.0000 | ORAL_TABLET | ORAL | Status: DC
  Administered 2014-03-21: 2 via ORAL
  Filled 2014-03-20: qty 2

## 2014-03-20 MED ORDER — FLEET ENEMA 7-19 GM/118ML RE ENEM: 1.0000 | ENEMA | RECTAL | Status: DC | PRN

## 2014-03-20 MED ORDER — DIPHENHYDRAMINE HCL 25 MG PO CAPS: 25.0000 mg | ORAL_CAPSULE | Freq: Four times a day (QID) | ORAL | Status: DC | PRN

## 2014-03-20 MED ORDER — PHENYLEPHRINE 40 MCG/ML (10ML) SYRINGE FOR IV PUSH (FOR BLOOD PRESSURE SUPPORT)
80.0000 ug | PREFILLED_SYRINGE | INTRAVENOUS | Status: DC | PRN
Start: 2014-03-20 — End: 2014-03-20
  Filled 2014-03-20: qty 2
  Filled 2014-03-20: qty 10

## 2014-03-20 MED ORDER — SIMETHICONE 80 MG PO CHEW: 80.0000 mg | CHEWABLE_TABLET | ORAL | Status: DC | PRN

## 2014-03-20 MED ORDER — EPHEDRINE 5 MG/ML INJ
10.0000 mg | INTRAVENOUS | Status: DC | PRN
  Filled 2014-03-20: qty 2

## 2014-03-20 MED ORDER — IBUPROFEN 600 MG PO TABS
600.0000 mg | ORAL_TABLET | Freq: Four times a day (QID) | ORAL | Status: DC
  Administered 2014-03-20 – 2014-03-21 (×6): 600 mg via ORAL
  Filled 2014-03-20 (×6): qty 1

## 2014-03-20 MED ORDER — ONDANSETRON HCL 4 MG PO TABS: 4.0000 mg | ORAL_TABLET | ORAL | Status: DC | PRN

## 2014-03-20 MED ORDER — LANOLIN HYDROUS EX OINT: TOPICAL_OINTMENT | CUTANEOUS | Status: DC | PRN

## 2014-03-20 MED ORDER — OXYCODONE-ACETAMINOPHEN 5-325 MG PO TABS: 1.0000 | ORAL_TABLET | ORAL | Status: DC | PRN

## 2014-03-20 MED ORDER — LACTATED RINGERS IV SOLN: 500.0000 mL | INTRAVENOUS | Status: DC | PRN

## 2014-03-20 MED ORDER — FENTANYL 2.5 MCG/ML BUPIVACAINE 1/10 % EPIDURAL INFUSION (WH - ANES)
14.0000 mL/h | INTRAMUSCULAR | Status: DC | PRN
  Administered 2014-03-20: 14 mL/h via EPIDURAL
  Filled 2014-03-20: qty 125

## 2014-03-20 MED ORDER — IBUPROFEN 600 MG PO TABS: 600.0000 mg | ORAL_TABLET | Freq: Four times a day (QID) | ORAL | Status: DC | PRN

## 2014-03-20 MED ORDER — ACETAMINOPHEN 325 MG PO TABS: 650.0000 mg | ORAL_TABLET | ORAL | Status: DC | PRN

## 2014-03-20 MED ORDER — EPHEDRINE 5 MG/ML INJ
10.0000 mg | INTRAVENOUS | Status: DC | PRN
  Filled 2014-03-20: qty 2
  Filled 2014-03-20: qty 4

## 2014-03-20 MED ORDER — WITCH HAZEL-GLYCERIN EX PADS
1.0000 | MEDICATED_PAD | CUTANEOUS | Status: DC | PRN
Start: 2014-03-20 — End: 2014-03-21

## 2014-03-20 MED ORDER — LACTATED RINGERS IV SOLN: 500.0000 mL | Freq: Once | INTRAVENOUS | Status: DC

## 2014-03-20 MED ORDER — LACTATED RINGERS IV SOLN
INTRAVENOUS | Status: DC
  Administered 2014-03-20: 03:00:00 via INTRAVENOUS

## 2014-03-20 MED ORDER — METHYLERGONOVINE MALEATE 0.2 MG/ML IJ SOLN: 0.2000 mg | INTRAMUSCULAR | Status: DC | PRN

## 2014-03-20 MED ORDER — OXYTOCIN 40 UNITS IN LACTATED RINGERS INFUSION - SIMPLE MED
62.5000 mL/h | INTRAVENOUS | Status: DC
  Filled 2014-03-20: qty 1000

## 2014-03-20 MED ORDER — ONDANSETRON HCL 4 MG/2ML IJ SOLN: 4.0000 mg | INTRAMUSCULAR | Status: DC | PRN

## 2014-03-20 MED ORDER — CITRIC ACID-SODIUM CITRATE 334-500 MG/5ML PO SOLN: 30.0000 mL | ORAL | Status: DC | PRN

## 2014-03-20 MED ORDER — METHYLERGONOVINE MALEATE 0.2 MG PO TABS: 0.2000 mg | ORAL_TABLET | ORAL | Status: DC | PRN

## 2014-03-20 NOTE — MAU Note (Signed)
Contractions every 2-3 minutes. One episode of fluid running down her legs today. Denies vaginal bleeding.

## 2014-03-20 NOTE — Anesthesia Postprocedure Evaluation (Signed)
  Anesthesia Post-op Note  Patient: Yvette Moore  Procedure(s) Performed: * No procedures listed *  Patient Location: PACU and Mother/Baby  Anesthesia Type:Epidural  Level of Consciousness: awake, alert  and oriented  Airway and Oxygen Therapy: Patient Spontanous Breathing  Post-op Pain: none  Post-op Assessment: Post-op Vital signs reviewed, Patient's Cardiovascular Status Stable, No headache, No backache, No residual numbness and No residual motor weakness  Post-op Vital Signs: Reviewed and stable  Complications: No apparent anesthesia complications

## 2014-03-20 NOTE — Anesthesia Procedure Notes (Signed)

## 2014-03-20 NOTE — Anesthesia Preprocedure Evaluation (Signed)

## 2014-03-20 NOTE — Lactation Note (Signed)
This note was copied from the chart of [place]. Lactation Consultation Note Follow up visit at 14 hours of age.  Mom reports baby is still sleepy, a little spity and has not latched since delivery. Baby asleep in crib, woke baby and placed STS with mom in football hold.  Attempted hand expression no colostrum visible at this time, but mom does return demonstration well.  Baby offered gloved finger for suck training.  Baby is disorganized with tight mouth, biting and tongue thrusting.  No latch achieved, baby remains STS with mom.  Encouraged to wake baby every 3 hours for feedings and feed with early hunger cues.  Report given to [facility] RN.     Patient Name: Yvette Moore [id number] Date: 03/20/2014 Reason for consult: Follow-up assessment;Difficult latch   Maternal Data Has patient been taught Hand Expression?: Yes Does the patient have breastfeeding experience prior to this delivery?: No (older son didn't latch)  Feeding Feeding Type: Breast Fed Length of feed:  (no suckling)  LATCH Score/Interventions Latch: Too sleepy or reluctant, no latch achieved, no sucking elicited.  Audible Swallowing: None  Type of Nipple: Everted at rest and after stimulation  Comfort (Breast/Nipple): Soft / non-tender     Hold (Positioning): No assistance needed to correctly position infant at breast. Intervention(s): Breastfeeding basics reviewed;Support Pillows;Position options;Skin to skin  LATCH Score: 6  Lactation Tools Discussed/Used     Consult Status Consult Status: Follow-up Date: 03/21/14 Follow-up type: In-patient    Justine Null Abrham Maslowski 03/20/2014, 8:56 PM

## 2014-03-20 NOTE — H&P (Signed)
Yvette Moore is a 33 y.o. female presenting for labor  33 yo G3P1011 @ 39+0 presents for painful contractions and leaking fluid. Pt confirmed to be in labor. Pregnancy has been uncomplicated History OB History   Grav Para Term Preterm Abortions TAB SAB Ect Mult Living   3 1 1  1  1         Past Medical History  Diagnosis Date  . Kidney infection 2011  . Anemia   . Vaginal Pap smear, abnormal    Past Surgical History  Procedure Laterality Date  . Eye surgery    . Leep  2002   Family History: family history is not on file. Social History:  reports that she has never smoked. She does not have any smokeless tobacco history on file. She reports that she does not drink alcohol or use illicit drugs.   Prenatal Transfer Tool  Maternal Diabetes: No, elevated 1 hr, normal 3 hr Genetic Screening: Normal Maternal Ultrasounds/Referrals: Normal Fetal Ultrasounds or other Referrals:  None Maternal Substance Abuse:  No Significant Maternal Medications:  None Significant Maternal Lab Results:  None Other Comments:  None  ROS: as above  Dilation: 6.5 Effacement (%): 100 Station: -1;0 Exam by:: Lamonte Sakai, RN Blood pressure 133/77, pulse 92, temperature 98.1 F (36.7 C), temperature source Oral, resp. rate 20, SpO2 100.00%. Exam Physical Exam  Prenatal labs: ABO, Rh: B/Positive/-- (10/21 0000) Antibody: Negative (10/21 0000) Rubella: Immune (10/21 0000) RPR: Nonreactive (10/21 0000)  HBsAg: Negative (10/21 0000)  HIV: Non-reactive (10/21 0000)  GBS: Negative (04/01 0000)   Assessment/Plan: 1) Admit 2) Epidural on request   Tonda Wiederhold H. Chamaine Stankus 03/20/2014, 4:08 AM

## 2014-03-20 NOTE — MAU Note (Signed)
PT SAYS UC Q 5 MIN.   WAS 2 CM IN OFFICE.  DENIES HSV AND MRSA.

## 2014-03-20 NOTE — MAU Provider Note (Signed)
S: Yvette Moore is a 33 y.o. G2P1 at [redacted]w[redacted]d who presents today with contractions and leaking of fluid. She states that she has been having contractions off and on for a couple days. At her most recent cervical check she states that she was 2cm. She denies any bleeding, but does report that she had "fluid running down her leg" earlier today. She states that the baby has been moving normally.   O: VSS, afebrile External: no lesion Vagina: pooling of clear mucous, ?fluid Cervix: pink, smooth, 3-4/100/-1/vtx/BBOW  Uterus: AGA FHT: 140, moderate with accels, no decels. Cat I Toco: q 4 mins Fern: positive   A/P: 33 y.o. G2P1 at [redacted]w[redacted]d  Reassuring M-F status RN to report with attending MD for plan of care  Mathis Bud

## 2014-03-21 LAB — CBC
HCT: 39.2 % (ref 36.0–46.0)
HEMOGLOBIN: 12.4 g/dL (ref 12.0–15.0)
MCH: 26.4 pg (ref 26.0–34.0)
MCHC: 31.6 g/dL (ref 30.0–36.0)
MCV: 83.4 fL (ref 78.0–100.0)
Platelets: 170 10*3/uL (ref 150–400)
RBC: 4.7 MIL/uL (ref 3.87–5.11)
RDW: 18.8 % — ABNORMAL HIGH (ref 11.5–15.5)
WBC: 11.1 10*3/uL — ABNORMAL HIGH (ref 4.0–10.5)

## 2014-03-21 MED ORDER — OXYCODONE-ACETAMINOPHEN 5-325 MG PO TABS: 1.0000 | ORAL_TABLET | ORAL | Status: DC | PRN

## 2014-03-21 NOTE — Discharge Summary (Signed)
Obstetric Discharge Summary Reason for Admission: onset of labor and rupture of membranes Prenatal Procedures: ultrasound Intrapartum Procedures: spontaneous vaginal delivery Postpartum Procedures: none Complications-Operative and Postpartum: none Hemoglobin  Date Value Ref Range Status  03/21/2014 12.4  12.0 - 15.0 g/dL Final     HCT  Date Value Ref Range Status  03/21/2014 39.2  36.0 - 46.0 % Final    Physical Exam:  General: alert Lochia: appropriate Uterine Fundus: firm  Discharge Diagnoses: Term Pregnancy-delivered  Discharge Information: Date: 03/21/2014 Activity: pelvic rest Diet: routine Medications: PNV, Ibuprofen and Percocet Condition: stable Instructions: refer to practice specific booklet Discharge to: home Follow-up Information   Follow up with [name]., MD In 4 weeks.   Specialty:  Obstetrics and Gynecology   Contact information:   [place] [id number] [phone]       Newborn Data: Live born female  Birth Weight: 7 lb 13 oz (3545 g) APGAR: 9, 9  Home with mother.  Yvette Moore 03/21/2014, 7:47 AM

## 2014-03-21 NOTE — Progress Notes (Signed)
PPD#1 Pt is doing well. Would like to go home. Will circ baby this am VSSAF IMP/ stable Plan/ Will discharge to home.

## 2014-03-22 ENCOUNTER — Ambulatory Visit: Payer: Self-pay

## 2014-03-22 NOTE — Lactation Note (Signed)
This note was copied from the chart of [place]. Lactation Consultation Note  Patient Name: Yvette Moore [id number] Date: 03/22/2014 Reason for consult: Follow-up assessment;Breast/nipple pain;Difficult latch, with pain too severe on (R) side to latch baby, per Mom.  LC called to assist with latch to (L) and mom has baby in sidelying football position but states baby is not opening mouth wide enough.  FOB has prepared the formula in curved-tip syringe.  LC demonstrated how to drip drops of formula on tip of baby's tongue and touch his lips to encourage him to open wider.  LC able to assist with deeper latch and mom reports no pain but after 5 minutes, he slips down to nipple and needs re-latching.  Second latch is easier and rhythmical sucking observed for additional 5 minutes.  FOB shown how to assist and stimulate baby to suck as well as how to compress mom's breast to enhance letdown and ensure that baby sustains deep latch.  LC reviewed feeding and pumping plan per previous [place] visit today.   Maternal Data    Feeding Feeding Type: Breast Fed  LATCH Score/Interventions Latch: Repeated attempts needed to sustain latch, nipple held in mouth throughout feeding, stimulation needed to elicit sucking reflex. (needs repeated attempts, chin tug and drops of formula on tongue) Intervention(s): Skin to skin;Teach feeding cues;Waking techniques Intervention(s): Adjust position;Assist with latch  Audible Swallowing: A few with stimulation (increased after second deeper latch) Intervention(s): Skin to skin;Hand expression Intervention(s): Skin to skin;Hand expression;Alternate breast massage  Type of Nipple: Everted at rest and after stimulation  Comfort (Breast/Nipple): Engorged, cracked, bleeding, large blisters, severe discomfort Problem noted: Cracked, bleeding, blisters, bruises Intervention(s): Expressed breast milk to nipple;Double electric pump     Hold (Positioning): Assistance  needed to correctly position infant at breast and maintain latch. (FOB shown how to assist and is able to per form chin tug) Intervention(s): Position options;Skin to skin;Support Pillows;Breastfeeding basics reviewed  LATCH Score: 5  Lactation Tools Discussed/Used Tools: Nipple Shields;Comfort gels (reinforced plan by previous LC) Nipple shield size: 24 STS, chin tug and stimulation techniques Breast compression  Consult Status Consult Status: Follow-up Date: 03/23/14 Follow-up type: In-patient    Landis Gandy 03/22/2014, 5:32 PM

## 2014-03-23 ENCOUNTER — Ambulatory Visit: Payer: Self-pay

## 2014-03-23 NOTE — Lactation Note (Signed)
This note was copied from the chart of [place]. Lactation Consultation Note  Patient Name: Yvette Moore Date: 03/23/2014   FOB recently fed baby and baby asleep. Mom is wanting to go home today, Peds plans to start double phototherapy and draw labs at 12:00 today, then decide if baby can go home on photo therapy. Mom is latching baby using nipple shield and reports this has decreased her nipple pain. Did not see latch at this visit. Mom is also post pumping to increase milk production. Breasts are beginning to fill. Parents have been supplementing with small amounts of mostly formula. Mom does not have DEBP for home use but is registered with [facility]. LC advised Mom to call [facility] to see if she can get a pump, [facility] referral completed by [facility] and faxed to [facility] office. [place] left phone number for Mom to call with next feeding so LC can observe latch with nipple shield.    Maternal Data    Feeding Feeding Type: Formula  [facility] Score/Interventions                      Lactation Tools Discussed/Used     Consult Status      Yvette Moore 03/23/2014, 2:12 PM

## 2014-03-23 NOTE — Lactation Note (Signed)
This note was copied from the chart of [place]. Lactation Consultation Note  Patient Name: Yvette Moore [id number] Date: 03/23/2014 Reason for consult: Follow-up assessment;Breast/nipple pain;Difficult latch;Infant weight loss;Hyperbilirubinemia Baby on double photo therapy. Mom did not know she could BF with photo therapy, FOB recently gave baby small amount of formula via curved tipped syringe. Assisted Mom with waking baby and latching in football hold. At the beginning of the feeding Mom was using #24 nipple shield and baby was struggling to organize his suck and latch. Changed nipple shield to size 20 and after few attempts baby latched and nursed demonstrating a good rhythmic suck with audible swallows for 25 minutes. Breast milk present in the nipple shield at the end of the feeding, Mom's nipple was round and Mom denied discomfort with baby at the breast. Due to weight loss and jaundice advised Mom to BF every 2-3 hours, keep baby nursing for 15-20 minutes, both breasts when possible. Post pump for 15 minutes to protect milk supply, supplement with 30 ml of EBM or formula after each feeding during the day, starting using bottle with slow flow nipple to supplement.  Mom unable to get to [facility] today, will do [facility] loaner. Baby will go home on double phototherapy. Monitor voids/stools. Engorgement care reviewed if needed. OP f/u scheduled for Thursday, 03/29/14 at 2:30.   Maternal Data    Feeding Feeding Type: Breast Fed Length of feed: 25 min  LATCH Score/Interventions Latch: Repeated attempts needed to sustain latch, nipple held in mouth throughout feeding, stimulation needed to elicit sucking reflex. (changed nipple shield to size 20) Intervention(s): Adjust position;Assist with latch;Breast massage;Breast compression  Audible Swallowing: Spontaneous and intermittent  Type of Nipple: Everted at rest and after stimulation (short nipple shaft)  Comfort (Breast/Nipple): Filling, red/small  blisters or bruises, mild/mod discomfort  Problem noted: Filling;Mild/Moderate discomfort Interventions (Mild/moderate discomfort): Post-pump  Hold (Positioning): Assistance needed to correctly position infant at breast and maintain latch. Intervention(s): Breastfeeding basics reviewed;Support Pillows;Position options;Skin to skin  LATCH Score: 7  Lactation Tools Discussed/Used Tools: Nipple Jefferson Fuel;Pump Nipple shield size: 20;24 Breast pump type: Double-Electric Breast Pump   Consult Status Consult Status: Follow-up Date: 03/23/14 Follow-up type: In-patient    Georga Kaufmann Gavriel Holzhauer 03/23/2014, 2:18 PM

## 2014-03-23 NOTE — Lactation Note (Signed)
This note was copied from the chart of [place]. Lactation Consultation Note  Patient Name: Yvette Moore [id number] Date: 03/23/2014 Reason for consult: Follow-up assessment;Pump rental [facility] loaner pump rental completed.   Maternal Data    Feeding Feeding Type: Formula Length of feed: 25 min  LATCH Score/Interventions Latch: Repeated attempts needed to sustain latch, nipple held in mouth throughout feeding, stimulation needed to elicit sucking reflex. (changed nipple shield to size 20) Intervention(s): Adjust position;Assist with latch;Breast massage;Breast compression  Audible Swallowing: Spontaneous and intermittent  Type of Nipple: Everted at rest and after stimulation (short nipple shaft)  Comfort (Breast/Nipple): Filling, red/small blisters or bruises, mild/mod discomfort  Problem noted: Filling;Mild/Moderate discomfort Interventions (Mild/moderate discomfort): Post-pump  Hold (Positioning): Assistance needed to correctly position infant at breast and maintain latch. Intervention(s): Breastfeeding basics reviewed;Support Pillows;Position options;Skin to skin  LATCH Score: 7  Lactation Tools Discussed/Used Tools: Nipple Jefferson Fuel;Pump Nipple shield size: 20;24 Breast pump type: Double-Electric Breast Pump   Consult Status Consult Status: Complete Date: 03/23/14 Follow-up type: In-patient    Yvette Moore 03/23/2014, 3:04 PM

## 2014-03-29 ENCOUNTER — Ambulatory Visit (HOSPITAL_COMMUNITY): Payer: 59

## 2014-10-01 ENCOUNTER — Encounter (HOSPITAL_COMMUNITY): Payer: Self-pay | Admitting: *Deleted

## 2015-06-25 LAB — OB RESULTS CONSOLE ANTIBODY SCREEN: Antibody Screen: NEGATIVE

## 2015-06-25 LAB — OB RESULTS CONSOLE ABO/RH: RH Type: POSITIVE

## 2015-06-25 LAB — OB RESULTS CONSOLE RPR: RPR: NONREACTIVE

## 2015-06-25 LAB — OB RESULTS CONSOLE HIV ANTIBODY (ROUTINE TESTING): HIV: NONREACTIVE

## 2015-06-25 LAB — OB RESULTS CONSOLE RUBELLA ANTIBODY, IGM: Rubella: IMMUNE

## 2015-06-25 LAB — OB RESULTS CONSOLE GC/CHLAMYDIA
Chlamydia: NEGATIVE
GC PROBE AMP, GENITAL: NEGATIVE

## 2015-06-25 LAB — OB RESULTS CONSOLE HEPATITIS B SURFACE ANTIGEN: Hepatitis B Surface Ag: NEGATIVE

## 2015-10-18 ENCOUNTER — Other Ambulatory Visit: Payer: Self-pay | Admitting: Obstetrics and Gynecology

## 2015-11-14 ENCOUNTER — Ambulatory Visit (HOSPITAL_COMMUNITY): Payer: BLUE CROSS/BLUE SHIELD

## 2015-11-21 ENCOUNTER — Encounter: Payer: BLUE CROSS/BLUE SHIELD | Attending: Obstetrics & Gynecology | Admitting: *Deleted

## 2015-11-21 ENCOUNTER — Ambulatory Visit (HOSPITAL_COMMUNITY)
Admission: RE | Admit: 2015-11-21 | Discharge: 2015-11-21 | Disposition: A | Discharge status: Home / Self Care | Payer: BLUE CROSS/BLUE SHIELD | Source: Ambulatory Visit | Attending: Obstetrics and Gynecology | Admitting: Obstetrics and Gynecology

## 2015-11-21 DIAGNOSIS — O24419 Gestational diabetes mellitus in pregnancy, unspecified control: Secondary | ICD-10-CM

## 2015-11-21 NOTE — Progress Notes (Signed)
  Patient was seen on 11/21/15 for Gestational Diabetes self-management . The following learning objectives were met by the patient :   States the definition of Gestational Diabetes  States why dietary management is important in controlling blood glucose  Describes the effects of carbohydrates on blood glucose levels  Demonstrates ability to create a balanced meal plan  Demonstrates carbohydrate counting   States when to check blood glucose levels  Demonstrates proper blood glucose monitoring techniques  States the effect of stress and exercise on blood glucose levels  States the importance of limiting caffeine and abstaining from alcohol and smoking  Plan:  Aim for 2 Carb Choices per meal (30 grams) +/- 1 either way for breakfast Aim for 3 Carb Choices per meal (45 grams) +/- 1 either way from lunch and dinner Aim for 1-2 Carbs per snack Begin reading food labels for Total Carbohydrate and sugar grams of foods Consider  increasing your activity level by walking daily as tolerated Begin checking BG before breakfast and 2 hours after first bit of breakfast, lunch and dinner after  as directed by MD  Take medication  as directed by MD  Blood glucose monitor given: Order for One Touch Ultra Mini Called to [facility] [address] Blood glucose reading: 160 1hr 40 mins pp  (Dr. Malachi Bonds)  Patient instructed to monitor glucose levels: FBS: 60 - <90 2 hour: <120  Patient received the following handouts:  Nutrition Diabetes and Pregnancy  Carbohydrate Counting List  Meal Planning worksheet  Patient will be seen for follow-up as needed.

## 2015-12-01 NOTE — L&D Delivery Note (Signed)
Pt was admitted in labor. She rapidly completed the first stage without difficulty. She pushed one time and had a SVD of one live viable black female over an intact perineum in the ROA position. Nuchal cord x 1. Placenta -S/I. EBL-400cc. Baby to NBN.

## 2015-12-10 LAB — OB RESULTS CONSOLE GBS: GBS: NEGATIVE

## 2015-12-31 ENCOUNTER — Telehealth (HOSPITAL_COMMUNITY): Payer: Self-pay | Admitting: *Deleted

## 2015-12-31 ENCOUNTER — Encounter (HOSPITAL_COMMUNITY): Payer: Self-pay | Admitting: *Deleted

## 2015-12-31 NOTE — Telephone Encounter (Signed)
Preadmission screen  

## 2016-01-05 ENCOUNTER — Inpatient Hospital Stay (HOSPITAL_COMMUNITY)
Admission: AD | Admit: 2016-01-05 | Discharge: 2016-01-07 | DRG: 775 | Disposition: A | Discharge status: Home / Self Care | Payer: BLUE CROSS/BLUE SHIELD | Source: Ambulatory Visit | Attending: Obstetrics and Gynecology | Admitting: Obstetrics and Gynecology

## 2016-01-05 ENCOUNTER — Encounter (HOSPITAL_COMMUNITY): Payer: Self-pay | Admitting: *Deleted

## 2016-01-05 ENCOUNTER — Inpatient Hospital Stay (HOSPITAL_COMMUNITY): Payer: BLUE CROSS/BLUE SHIELD | Admitting: Anesthesiology

## 2016-01-05 DIAGNOSIS — IMO0001 Reserved for inherently not codable concepts without codable children: Secondary | ICD-10-CM

## 2016-01-05 DIAGNOSIS — Z3A39 39 weeks gestation of pregnancy: Secondary | ICD-10-CM

## 2016-01-05 DIAGNOSIS — O24425 Gestational diabetes mellitus in childbirth, controlled by oral hypoglycemic drugs: Secondary | ICD-10-CM | POA: Diagnosis present

## 2016-01-05 LAB — CBC
HCT: 37.2 % (ref 36.0–46.0)
HEMOGLOBIN: 11.8 g/dL — AB (ref 12.0–15.0)
MCH: 24 pg — ABNORMAL LOW (ref 26.0–34.0)
MCHC: 31.7 g/dL (ref 30.0–36.0)
MCV: 75.6 fL — ABNORMAL LOW (ref 78.0–100.0)
PLATELETS: 248 10*3/uL (ref 150–400)
RBC: 4.92 MIL/uL (ref 3.87–5.11)
RDW: 15.6 % — ABNORMAL HIGH (ref 11.5–15.5)
WBC: 11.1 10*3/uL — AB (ref 4.0–10.5)

## 2016-01-05 LAB — TYPE AND SCREEN
ABO/RH(D): B POS
ANTIBODY SCREEN: NEGATIVE

## 2016-01-05 MED ORDER — LIDOCAINE HCL (PF) 1 % IJ SOLN
30.0000 mL | INTRAMUSCULAR | Status: DC | PRN
  Filled 2016-01-05: qty 30

## 2016-01-05 MED ORDER — PHENYLEPHRINE 40 MCG/ML (10ML) SYRINGE FOR IV PUSH (FOR BLOOD PRESSURE SUPPORT)
80.0000 ug | PREFILLED_SYRINGE | INTRAVENOUS | Status: DC | PRN
  Filled 2016-01-05: qty 2

## 2016-01-05 MED ORDER — LACTATED RINGERS IV SOLN
INTRAVENOUS | Status: DC
  Administered 2016-01-05: 16:00:00 via INTRAVENOUS

## 2016-01-05 MED ORDER — FENTANYL 2.5 MCG/ML BUPIVACAINE 1/10 % EPIDURAL INFUSION (WH - ANES)
14.0000 mL/h | INTRAMUSCULAR | Status: DC | PRN
  Administered 2016-01-05: 14 mL/h via EPIDURAL

## 2016-01-05 MED ORDER — ACETAMINOPHEN 325 MG PO TABS
650.0000 mg | ORAL_TABLET | ORAL | Status: DC | PRN
  Administered 2016-01-06: 650 mg via ORAL

## 2016-01-05 MED ORDER — SENNOSIDES-DOCUSATE SODIUM 8.6-50 MG PO TABS
2.0000 | ORAL_TABLET | ORAL | Status: DC
  Administered 2016-01-05 – 2016-01-07 (×2): 2 via ORAL
  Filled 2016-01-05 (×2): qty 2

## 2016-01-05 MED ORDER — ONDANSETRON HCL 4 MG PO TABS: 4.0000 mg | ORAL_TABLET | ORAL | Status: DC | PRN

## 2016-01-05 MED ORDER — DIPHENHYDRAMINE HCL 25 MG PO CAPS: 25.0000 mg | ORAL_CAPSULE | Freq: Four times a day (QID) | ORAL | Status: DC | PRN

## 2016-01-05 MED ORDER — IBUPROFEN 600 MG PO TABS
600.0000 mg | ORAL_TABLET | Freq: Four times a day (QID) | ORAL | Status: DC
  Administered 2016-01-05 – 2016-01-07 (×5): 600 mg via ORAL
  Filled 2016-01-05 (×5): qty 1

## 2016-01-05 MED ORDER — OXYTOCIN 10 UNIT/ML IJ SOLN
2.5000 [IU]/h | INTRAMUSCULAR | Status: DC
  Filled 2016-01-05: qty 4

## 2016-01-05 MED ORDER — SIMETHICONE 80 MG PO CHEW: 80.0000 mg | CHEWABLE_TABLET | ORAL | Status: DC | PRN

## 2016-01-05 MED ORDER — LACTATED RINGERS IV SOLN: 500.0000 mL | INTRAVENOUS | Status: DC | PRN

## 2016-01-05 MED ORDER — WITCH HAZEL-GLYCERIN EX PADS: 1.0000 "application " | MEDICATED_PAD | CUTANEOUS | Status: DC | PRN

## 2016-01-05 MED ORDER — OXYCODONE-ACETAMINOPHEN 5-325 MG PO TABS
1.0000 | ORAL_TABLET | ORAL | Status: DC | PRN
  Administered 2016-01-05: 1 via ORAL
  Filled 2016-01-05: qty 1

## 2016-01-05 MED ORDER — ONDANSETRON HCL 4 MG/2ML IJ SOLN: 4.0000 mg | INTRAMUSCULAR | Status: DC | PRN

## 2016-01-05 MED ORDER — ONDANSETRON HCL 4 MG/2ML IJ SOLN: 4.0000 mg | Freq: Four times a day (QID) | INTRAMUSCULAR | Status: DC | PRN

## 2016-01-05 MED ORDER — DIBUCAINE 1 % RE OINT: 1.0000 "application " | TOPICAL_OINTMENT | RECTAL | Status: DC | PRN

## 2016-01-05 MED ORDER — PRENATAL MULTIVITAMIN CH
1.0000 | ORAL_TABLET | Freq: Every day | ORAL | Status: DC
  Administered 2016-01-06: 1 via ORAL
  Filled 2016-01-05: qty 1

## 2016-01-05 MED ORDER — FENTANYL 2.5 MCG/ML BUPIVACAINE 1/10 % EPIDURAL INFUSION (WH - ANES)
INTRAMUSCULAR | Status: AC
  Filled 2016-01-05: qty 125

## 2016-01-05 MED ORDER — DIPHENHYDRAMINE HCL 50 MG/ML IJ SOLN: 12.5000 mg | INTRAMUSCULAR | Status: DC | PRN

## 2016-01-05 MED ORDER — ZOLPIDEM TARTRATE 5 MG PO TABS: 5.0000 mg | ORAL_TABLET | Freq: Every evening | ORAL | Status: DC | PRN

## 2016-01-05 MED ORDER — FLEET ENEMA 7-19 GM/118ML RE ENEM: 1.0000 | ENEMA | RECTAL | Status: DC | PRN

## 2016-01-05 MED ORDER — ACETAMINOPHEN 325 MG PO TABS
650.0000 mg | ORAL_TABLET | ORAL | Status: DC | PRN
  Filled 2016-01-05: qty 2

## 2016-01-05 MED ORDER — OXYCODONE-ACETAMINOPHEN 5-325 MG PO TABS: 2.0000 | ORAL_TABLET | ORAL | Status: DC | PRN

## 2016-01-05 MED ORDER — EPHEDRINE 5 MG/ML INJ
10.0000 mg | INTRAVENOUS | Status: DC | PRN
  Filled 2016-01-05: qty 2

## 2016-01-05 MED ORDER — LIDOCAINE HCL (PF) 1 % IJ SOLN
INTRAMUSCULAR | Status: DC | PRN
  Administered 2016-01-05: 3 mL
  Administered 2016-01-05: 5 mL
  Administered 2016-01-05: 2 mL via EPIDURAL

## 2016-01-05 MED ORDER — PHENYLEPHRINE 40 MCG/ML (10ML) SYRINGE FOR IV PUSH (FOR BLOOD PRESSURE SUPPORT)
PREFILLED_SYRINGE | INTRAVENOUS | Status: DC
Start: 2016-01-05 — End: 2016-01-05
  Filled 2016-01-05: qty 20

## 2016-01-05 MED ORDER — LANOLIN HYDROUS EX OINT: TOPICAL_OINTMENT | CUTANEOUS | Status: DC | PRN

## 2016-01-05 MED ORDER — CITRIC ACID-SODIUM CITRATE 334-500 MG/5ML PO SOLN
30.0000 mL | ORAL | Status: DC | PRN
Start: 2016-01-05 — End: 2016-01-07

## 2016-01-05 MED ORDER — OXYTOCIN BOLUS FROM INFUSION
500.0000 mL | INTRAVENOUS | Status: DC
  Administered 2016-01-05: 500 mL via INTRAVENOUS

## 2016-01-05 MED ORDER — BENZOCAINE-MENTHOL 20-0.5 % EX AERO: 1.0000 "application " | INHALATION_SPRAY | CUTANEOUS | Status: DC | PRN

## 2016-01-05 NOTE — Anesthesia Procedure Notes (Signed)
Epidural Patient location during procedure: OB  Staffing Anesthesiologist: Suzette Battiest Performed by: anesthesiologist   Preanesthetic Checklist Completed: patient identified, site marked, surgical consent, pre-op evaluation, timeout performed, IV checked, risks and benefits discussed and monitors and equipment checked  Epidural Patient position: sitting Prep: site prepped and draped and DuraPrep Patient monitoring: continuous pulse ox and blood pressure Approach: midline Location: L4-L5 Injection technique: LOR saline  Needle:  Needle type: Tuohy  Needle gauge: 17 G Needle length: 9 cm and 9 Needle insertion depth: 6 cm Catheter type: closed end flexible Catheter size: 19 Gauge Catheter at skin depth: 11 cm Test dose: negative  Assessment Events: blood not aspirated, injection not painful, no injection resistance, negative IV test and no paresthesia

## 2016-01-05 NOTE — H&P (Signed)
Pt is a 35 y/o black female who presents to L&D with SROM. PNC was complicated by GDM on glyburide. BSs have been well controlled PMHX: See hollister PE: VSSAF         HEENT-wnl         Abd- gravid IMP/ IUP at term         SROM          GDM Plan/ Admit

## 2016-01-05 NOTE — MAU Note (Signed)
Reg ct since 11am. Denies vag bleeding or discharge. Pt very uncomfortable and brought strait back

## 2016-01-05 NOTE — Anesthesia Preprocedure Evaluation (Signed)
Anesthesia Evaluation  Patient identified by MRN, date of birth, ID band Patient awake    Reviewed: Allergy & Precautions, Patient's Chart, lab work & pertinent test results  Airway Mallampati: II  TM Distance: >3 FB     Dental   Pulmonary neg pulmonary ROS,    breath sounds clear to auscultation       Cardiovascular negative cardio ROS   Rhythm:Regular Rate:Normal     Neuro/Psych negative neurological ROS     GI/Hepatic negative GI ROS, Neg liver ROS,   Endo/Other  negative endocrine ROS  Renal/GU negative Renal ROS     Musculoskeletal   Abdominal   Peds  Hematology negative hematology ROS (+)   Anesthesia Other Findings   Reproductive/Obstetrics (+) Pregnancy                             Lab Results  Component Value Date   WBC 11.1* 01/05/2016   HGB 11.8* 01/05/2016   HCT 37.2 01/05/2016   MCV 75.6* 01/05/2016   PLT 248 01/05/2016    Anesthesia Physical Anesthesia Plan  ASA: II  Anesthesia Plan: Epidural   Post-op Pain Management:    Induction:   Airway Management Planned:   Additional Equipment:   Intra-op Plan:   Post-operative Plan:   Informed Consent: I have reviewed the patients History and Physical, chart, labs and discussed the procedure including the risks, benefits and alternatives for the proposed anesthesia with the patient or authorized representative who has indicated his/her understanding and acceptance.     Plan Discussed with:   Anesthesia Plan Comments:         Anesthesia Quick Evaluation

## 2016-01-05 NOTE — H&P (Signed)
Pt is a 35 y/o black female, [id number] at term who is admitted in labor. PNC was complicated by GDM txd with diet and Glyguride. On admission she was 5 cm.  PMHX: See [facility] PE: VSSAF        HEENT-wnl        ABD-gravid, palp contractions        FHTs- reactive IMP/ IUP at term in labor         GDM on Glyburide Plan/ Admit

## 2016-01-06 ENCOUNTER — Inpatient Hospital Stay (HOSPITAL_COMMUNITY): Admission: RE | Admit: 2016-01-06 | Payer: BLUE CROSS/BLUE SHIELD | Source: Ambulatory Visit

## 2016-01-06 LAB — CBC
HCT: 34.9 % — ABNORMAL LOW (ref 36.0–46.0)
HEMOGLOBIN: 11 g/dL — AB (ref 12.0–15.0)
MCH: 23.9 pg — AB (ref 26.0–34.0)
MCHC: 31.5 g/dL (ref 30.0–36.0)
MCV: 75.9 fL — AB (ref 78.0–100.0)
PLATELETS: 221 10*3/uL (ref 150–400)
RBC: 4.6 MIL/uL (ref 3.87–5.11)
RDW: 15.6 % — ABNORMAL HIGH (ref 11.5–15.5)
WBC: 13.1 10*3/uL — AB (ref 4.0–10.5)

## 2016-01-06 LAB — GLUCOSE, CAPILLARY: GLUCOSE-CAPILLARY: 109 mg/dL — AB (ref 65–99)

## 2016-01-06 LAB — RPR: RPR: NONREACTIVE

## 2016-01-06 NOTE — Progress Notes (Signed)
Post Partum Day 1 Subjective: no complaints, up ad lib and voiding  Objective: Blood pressure 119/65, pulse 65, temperature 98 F (36.7 C), temperature source Oral, resp. rate 18, height 5\' 4"  (1.626 m), weight 97.523 kg (215 lb), last menstrual period 04/13/2015, SpO2 96 %, unknown if currently breastfeeding.  Physical Exam:  General: alert, cooperative and appears stated age Lochia: appropriate Uterine Fundus: firm    Recent Labs  01/05/16 1546 01/06/16 0602  HGB 11.8* 11.0*  HCT 37.2 34.9*    Assessment/Plan: Plan for discharge tomorrow   LOS: 1 day   Jenae Tomasello H. 01/06/2016, 8:15 AM

## 2016-01-06 NOTE — Anesthesia Postprocedure Evaluation (Signed)
Anesthesia Post Note  Patient: UYEN SHELLY  Procedure(s) Performed: * No procedures listed *  Patient location during evaluation: Mother Baby Anesthesia Type: Epidural Level of consciousness: awake Pain management: pain level controlled Vital Signs Assessment: post-procedure vital signs reviewed and stable Respiratory status: spontaneous breathing Cardiovascular status: stable Postop Assessment: no headache, no backache, epidural receding, patient able to bend at knees, no signs of nausea or vomiting and adequate PO intake Anesthetic complications: no (states pain level a 0 now;epidural received too late to take effect for delivery;pt states by the time it took effect she was holding her baby;)    Last Vitals:  Filed Vitals:   01/06/16 0125 01/06/16 0630  BP: 107/56 119/65  Pulse: 88 65  Temp: 36.9 C 36.7 C  Resp: 18 18    Last Pain:  Filed Vitals:   01/06/16 0835  PainSc: 0-No pain                 Everette Rank

## 2016-01-06 NOTE — Lactation Note (Addendum)
This note was copied from the chart of Yvette Moore. Lactation Consultation Note Mom's third baby. Unable to BF her first baby d/t couldn't latch baby to breast, so she bottle fed. Her second baby she BF for approx. 6 weeks. She was breast/formula. Mom mentions when I was going over information in [place] and Me booklet about engorgement, mom commented that she became engorged with any of her other 2 children, even when she decided not to BF her first baby. Mom has large pendulum breast, breast slightly heavy feeling to outer aspect of breast. Has slight edema to Lt. Leg, Rt. Breast areola feels as if it has some edema, areola not as compressible as Lt.  Asked mom if she had colostrum and stated yes. I demonstrated hand expression and Nothing was able to be expressed. I massaged breast and hand expressed for several minutes. Asked mom to demonstrate hand expression and mom didn't get anything. Mom stated they felt heavy that why she thought she had some. Baby latched to Lt. Breast which mom stated she needed to go to that breast next. In football hold baby latched, needing to adjust mouth wider. Baby BF well, no swallows heard. Noted nipple w/slight pinched look. Asked mom did BF hurt, stated no. Explained we prefer not to have the nipple to look pinched even slightly after BF. Maybe need to obtain a wider and deeper latch. Encouraged mom I&O documentation was important.asked mom how much formula did she give her 2nd child when she was breast and formula feeding. Mom stated she would give breast and baby would BF a few minutes then bottle, or if baby didn't want breast she would give bottle. I am concerned if mom had milk with her other 2 children. Mom shown how to use DEBP & how to disassemble, clean, & reassemble parts. Done for breast stimulation, explained to mom she probably will not see colostrum not to be concern as of yet. To BF then pump after every BF for 15-20 min.  Educated about newborn behavior,  STS, cluster feeding, supply and demand. Mom encouraged to feed baby 8-12 times/24 hours and with feeding cues. Encouraged to call for assistance if needed and to verify proper latch. Referred to Baby and Me Book in Breastfeeding section Pg. 22-23 for position options and Proper latch demonstration.Mom encouraged to do skin-to-skin. [place] brochure given w/resources, support groups and [place] services. Patient Name: Yvette Moore [id number] Date: 01/06/2016 Reason for consult: Initial assessment   Maternal Data Has patient been taught Hand Expression?: Yes Does the patient have breastfeeding experience prior to this delivery?: Yes  Feeding Feeding Type: Breast Fed Length of feed: 10 min  LATCH Score/Interventions Latch: Repeated attempts needed to sustain latch, nipple held in mouth throughout feeding, stimulation needed to elicit sucking reflex. Intervention(s): Adjust position;Assist with latch;Breast massage;Breast compression  Audible Swallowing: None Intervention(s): Skin to skin;Hand expression Intervention(s): Alternate breast massage  Type of Nipple: Everted at rest and after stimulation  Comfort (Breast/Nipple): Soft / non-tender     Hold (Positioning): Assistance needed to correctly position infant at breast and maintain latch. Intervention(s): Breastfeeding basics reviewed;Support Pillows;Position options;Skin to skin  LATCH Score: 6  Lactation Tools Discussed/Used Tools: Shells;Pump Shell Type: Inverted Breast pump type: Double-Electric Breast Pump WIC Program: Yes Pump Review: Setup, frequency, and cleaning;Milk Storage Initiated by:: Allayne Stack RN Date initiated:: 01/06/16   Consult Status Consult Status: Follow-up Date: 01/06/16 (in pm) Follow-up type: In-patient    Icy Fuhrmann, Elta Gu[place] 01/06/2016, 2:26 AM

## 2016-01-06 NOTE — Addendum Note (Signed)
Addendum  created 01/06/16 0859 by Georgeanne Nim, CRNA   Modules edited: Charges VN, Clinical Notes   Clinical Notes:  File: [id number]

## 2016-01-07 MED ORDER — IBUPROFEN 600 MG PO TABS
600.0000 mg | ORAL_TABLET | Freq: Four times a day (QID) | ORAL | Status: DC
Start: 2016-01-07 — End: 2017-07-08

## 2016-01-07 NOTE — Discharge Summary (Signed)
Obstetric Discharge Summary Reason for Admission: rupture of membranes Prenatal Procedures: NST and ultrasound Intrapartum Procedures: spontaneous vaginal delivery Postpartum Procedures: none Complications-Operative and Postpartum: none HEMOGLOBIN  Date Value Ref Range Status  01/06/2016 11.0* 12.0 - 15.0 g/dL Final   HCT  Date Value Ref Range Status  01/06/2016 34.9* 36.0 - 46.0 % Final    Physical Exam:  General: alert Lochia: appropriate Uterine Fundus: firm   Discharge Diagnoses: Term Pregnancy-delivered and GDM  Discharge Information: Date: 01/07/2016 Activity: pelvic rest Diet: routine Medications: PNV and Ibuprofen Condition: stable Instructions: refer to practice specific booklet Discharge to: home Follow-up Information    Follow up with Olga Millers, MD. Schedule an appointment as soon as possible for a visit in 1 month.   Specialty:  Obstetrics and Gynecology   Contact information:   [place] [id number] [phone]       Newborn Data: Live born female  Birth Weight: 6 lb 13.3 oz (3099 g) APGAR: 9, 9  Home with mother.  Heydy Montilla E 01/07/2016, 9:09 AM

## 2016-01-07 NOTE — Lactation Note (Signed)
This note was copied from the chart of Yvette Shun Faughnan. Lactation Consultation Note  Patient Name: Yvette Moore [id number] Date: 01/07/2016 Reason for consult: Follow-up assessment   With this mom and term baby, now 66 hours old. I assisted mom with latching the baby in cross cradle hold, and the baby latched deeply and quickly, strong suckles and lots of breast movement. Mom encouraged to make pediatrician's visit for tomorrow, to follow baby's weight. Mom is exclusively breast feeding for the first time, and si concerned about her supply. Mom has tiny shimmer of colostrum with hand expression at this time. Breast care/engorgement reviewed with mom, as well as some basic breast feeding review in the Baby and Me book. I made an o/p lactation appointment for mom and baby , for 2/14. Mom knows to call for questions/concerns.    Maternal Data    Feeding Feeding Type: Breast Fed Length of feed:  (cluster feeding)  LATCH Score/Interventions Latch: Grasps breast easily, tongue down, lips flanged, rhythmical sucking. Intervention(s): Adjust position;Assist with latch;Breast compression;Breast massage  Audible Swallowing: A few with stimulation  Type of Nipple: Everted at rest and after stimulation (semi flat but compressible)  Comfort (Breast/Nipple): Soft / non-tender  Problem noted: Mild/Moderate discomfort Interventions (Mild/moderate discomfort): Hand expression;Comfort gels  Hold (Positioning): Assistance needed to correctly position infant at breast and maintain latch. Intervention(s): Breastfeeding basics reviewed;Support Pillows;Position options;Skin to skin  LATCH Score: 8  Lactation Tools Discussed/Used WIC Program: Yes (fax sent for [facility] to call mom for appointmetn, and possible need for DEP) Pump Review: Setup, frequency, and cleaning;Milk Storage   Consult Status Consult Status: Complete Date: 01/14/16 Follow-up type: Out-patient    Tonna Corner 01/07/2016, 9:11  AM

## 2016-01-07 NOTE — Progress Notes (Signed)
PPD#2 Pt without complaints.Would like to go home. VSSAF IMP/ Doing well Plan/ Will discharge

## 2016-01-07 NOTE — Progress Notes (Addendum)
Discharge education completed, discharge instructions given and follow up appointments discussed. Patient verbalized understanding.

## 2016-01-14 ENCOUNTER — Encounter (HOSPITAL_COMMUNITY): Payer: BLUE CROSS/BLUE SHIELD

## 2016-03-23 DIAGNOSIS — Z0000 Encounter for general adult medical examination without abnormal findings: Secondary | ICD-10-CM | POA: Diagnosis not present

## 2016-03-27 DIAGNOSIS — J3089 Other allergic rhinitis: Secondary | ICD-10-CM | POA: Diagnosis not present

## 2016-07-28 DIAGNOSIS — Z713 Dietary counseling and surveillance: Secondary | ICD-10-CM | POA: Diagnosis not present

## 2016-07-30 ENCOUNTER — Encounter (INDEPENDENT_AMBULATORY_CARE_PROVIDER_SITE_OTHER): Payer: BLUE CROSS/BLUE SHIELD | Admitting: Ophthalmology

## 2016-07-30 DIAGNOSIS — H43813 Vitreous degeneration, bilateral: Secondary | ICD-10-CM | POA: Diagnosis not present

## 2016-07-30 DIAGNOSIS — H3562 Retinal hemorrhage, left eye: Secondary | ICD-10-CM

## 2016-07-30 DIAGNOSIS — H2512 Age-related nuclear cataract, left eye: Secondary | ICD-10-CM | POA: Diagnosis not present

## 2016-07-30 DIAGNOSIS — H35033 Hypertensive retinopathy, bilateral: Secondary | ICD-10-CM | POA: Diagnosis not present

## 2016-07-30 DIAGNOSIS — I10 Essential (primary) hypertension: Secondary | ICD-10-CM | POA: Diagnosis not present

## 2016-07-30 DIAGNOSIS — D3131 Benign neoplasm of right choroid: Secondary | ICD-10-CM

## 2016-08-05 DIAGNOSIS — H31092 Other chorioretinal scars, left eye: Secondary | ICD-10-CM | POA: Diagnosis not present

## 2016-08-05 DIAGNOSIS — H35022 Exudative retinopathy, left eye: Secondary | ICD-10-CM | POA: Diagnosis not present

## 2016-08-05 DIAGNOSIS — D3131 Benign neoplasm of right choroid: Secondary | ICD-10-CM | POA: Diagnosis not present

## 2016-08-05 DIAGNOSIS — H3589 Other specified retinal disorders: Secondary | ICD-10-CM | POA: Diagnosis not present

## 2016-08-17 DIAGNOSIS — H35023 Exudative retinopathy, bilateral: Secondary | ICD-10-CM | POA: Diagnosis not present

## 2016-08-21 DIAGNOSIS — Z713 Dietary counseling and surveillance: Secondary | ICD-10-CM | POA: Diagnosis not present

## 2016-08-21 DIAGNOSIS — J3089 Other allergic rhinitis: Secondary | ICD-10-CM | POA: Diagnosis not present

## 2016-09-25 DIAGNOSIS — Z713 Dietary counseling and surveillance: Secondary | ICD-10-CM | POA: Diagnosis not present

## 2016-09-29 ENCOUNTER — Encounter (INDEPENDENT_AMBULATORY_CARE_PROVIDER_SITE_OTHER): Payer: BLUE CROSS/BLUE SHIELD | Admitting: Ophthalmology

## 2016-09-29 DIAGNOSIS — H43813 Vitreous degeneration, bilateral: Secondary | ICD-10-CM | POA: Diagnosis not present

## 2016-09-29 DIAGNOSIS — D3131 Benign neoplasm of right choroid: Secondary | ICD-10-CM

## 2016-09-29 DIAGNOSIS — H35022 Exudative retinopathy, left eye: Secondary | ICD-10-CM

## 2016-11-11 DIAGNOSIS — Z713 Dietary counseling and surveillance: Secondary | ICD-10-CM | POA: Diagnosis not present

## 2016-12-22 DIAGNOSIS — Z713 Dietary counseling and surveillance: Secondary | ICD-10-CM | POA: Diagnosis not present

## 2017-03-02 DIAGNOSIS — Z713 Dietary counseling and surveillance: Secondary | ICD-10-CM | POA: Diagnosis not present

## 2017-03-19 ENCOUNTER — Encounter (HOSPITAL_BASED_OUTPATIENT_CLINIC_OR_DEPARTMENT_OTHER): Payer: Self-pay | Admitting: *Deleted

## 2017-03-19 ENCOUNTER — Emergency Department (HOSPITAL_BASED_OUTPATIENT_CLINIC_OR_DEPARTMENT_OTHER)
Admission: EM | Admit: 2017-03-19 | Discharge: 2017-03-19 | Disposition: A | Discharge status: Home / Self Care | Payer: BLUE CROSS/BLUE SHIELD | Attending: Emergency Medicine | Admitting: Emergency Medicine

## 2017-03-19 ENCOUNTER — Emergency Department (HOSPITAL_BASED_OUTPATIENT_CLINIC_OR_DEPARTMENT_OTHER): Payer: BLUE CROSS/BLUE SHIELD

## 2017-03-19 DIAGNOSIS — Y999 Unspecified external cause status: Secondary | ICD-10-CM | POA: Insufficient documentation

## 2017-03-19 DIAGNOSIS — Y92410 Unspecified street and highway as the place of occurrence of the external cause: Secondary | ICD-10-CM | POA: Insufficient documentation

## 2017-03-19 DIAGNOSIS — Y9389 Activity, other specified: Secondary | ICD-10-CM | POA: Diagnosis not present

## 2017-03-19 DIAGNOSIS — R51 Headache: Secondary | ICD-10-CM | POA: Diagnosis not present

## 2017-03-19 DIAGNOSIS — R41840 Attention and concentration deficit: Secondary | ICD-10-CM | POA: Diagnosis not present

## 2017-03-19 DIAGNOSIS — R0789 Other chest pain: Secondary | ICD-10-CM

## 2017-03-19 DIAGNOSIS — S299XXA Unspecified injury of thorax, initial encounter: Secondary | ICD-10-CM | POA: Diagnosis not present

## 2017-03-19 MED ORDER — IBUPROFEN 400 MG PO TABS
600.0000 mg | ORAL_TABLET | Freq: Once | ORAL | Status: AC
  Administered 2017-03-19: 600 mg via ORAL
  Filled 2017-03-19: qty 1

## 2017-03-19 MED ORDER — IBUPROFEN 600 MG PO TABS: 600.0000 mg | ORAL_TABLET | Freq: Four times a day (QID) | ORAL | 0 refills | Status: DC | PRN

## 2017-03-19 MED FILL — IBUPROFEN 600 MG TABLET: 600 | 8 days supply | Qty: 30 | Fill #0
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
## 2017-03-19 NOTE — ED Notes (Signed)
ED Provider at bedside. 
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
## 2017-05-25 DIAGNOSIS — Z713 Dietary counseling and surveillance: Secondary | ICD-10-CM | POA: Diagnosis not present

## 2017-07-04 DIAGNOSIS — R21 Rash and other nonspecific skin eruption: Secondary | ICD-10-CM | POA: Diagnosis not present

## 2017-07-08 ENCOUNTER — Ambulatory Visit (INDEPENDENT_AMBULATORY_CARE_PROVIDER_SITE_OTHER): Payer: BLUE CROSS/BLUE SHIELD | Admitting: Adult Health

## 2017-07-08 ENCOUNTER — Encounter: Payer: Self-pay | Admitting: Adult Health

## 2017-07-08 VITALS — BP 94/60 | Temp 98.4°F | Ht 64.75 in | Wt 163.0 lb

## 2017-07-08 DIAGNOSIS — Z0000 Encounter for general adult medical examination without abnormal findings: Secondary | ICD-10-CM

## 2017-07-08 NOTE — Patient Instructions (Addendum)
It was great meeting you today   Please follow up for blood work one morning- you can schedule this at the front desk   If everything is normal then I will see you yearly or whenever you need  Good luck with the 5 K and half marathon   Health Maintenance, Female Adopting a healthy lifestyle and getting preventive care can go a long way to promote health and wellness. Talk with your health care provider about what schedule of regular examinations is right for you. This is a good chance for you to check in with your provider about disease prevention and staying healthy. In between checkups, there are plenty of things you can do on your own. Experts have done a lot of research about which lifestyle changes and preventive measures are most likely to keep you healthy. Ask your health care provider for more information. Weight and diet Eat a healthy diet  Be sure to include plenty of vegetables, fruits, low-fat dairy products, and lean protein.  Do not eat a lot of foods high in solid fats, added sugars, or salt.  Get regular exercise. This is one of the most important things you can do for your health. ? Most adults should exercise for at least 150 minutes each week. The exercise should increase your heart rate and make you sweat (moderate-intensity exercise). ? Most adults should also do strengthening exercises at least twice a week. This is in addition to the moderate-intensity exercise.  Maintain a healthy weight  Body mass index (BMI) is a measurement that can be used to identify possible weight problems. It estimates body fat based on height and weight. Your health care provider can help determine your BMI and help you achieve or maintain a healthy weight.  For females 21 years of age and older: ? A BMI below 18.5 is considered underweight. ? A BMI of 18.5 to 24.9 is normal. ? A BMI of 25 to 29.9 is considered overweight. ? A BMI of 30 and above is considered obese.  Watch levels of  cholesterol and blood lipids  You should start having your blood tested for lipids and cholesterol at 34 years of age, then have this test every 5 years.  You may need to have your cholesterol levels checked more often if: ? Your lipid or cholesterol levels are high. ? You are older than 34 years of age. ? You are at high risk for heart disease.  Cancer screening Lung Cancer  Lung cancer screening is recommended for adults 3-34 years old who are at high risk for lung cancer because of a history of smoking.  A yearly low-dose CT scan of the lungs is recommended for people who: ? Currently smoke. ? Have quit within the past 15 years. ? Have at least a 30-pack-year history of smoking. A pack year is smoking an average of one pack of cigarettes a day for 1 year.  Yearly screening should continue until it has been 15 years since you quit.  Yearly screening should stop if you develop a health problem that would prevent you from having lung cancer treatment.  Breast Cancer  Practice breast self-awareness. This means understanding how your breasts normally appear and feel.  It also means doing regular breast self-exams. Let your health care provider know about any changes, no matter how small.  If you are in your 20s or 30s, you should have a clinical breast exam (CBE) by a health care provider every 1-3 years as part  of a regular health exam.  If you are 76 or older, have a CBE every year. Also consider having a breast X-ray (mammogram) every year.  If you have a family history of breast cancer, talk to your health care provider about genetic screening.  If you are at high risk for breast cancer, talk to your health care provider about having an MRI and a mammogram every year.  Breast cancer gene (BRCA) assessment is recommended for women who have family members with BRCA-related cancers. BRCA-related cancers include: ? Breast. ? Ovarian. ? Tubal. ? Peritoneal cancers.  Results  of the assessment will determine the need for genetic counseling and BRCA1 and BRCA2 testing.  Cervical Cancer Your health care provider may recommend that you be screened regularly for cancer of the pelvic organs (ovaries, uterus, and vagina). This screening involves a pelvic examination, including checking for microscopic changes to the surface of your cervix (Pap test). You may be encouraged to have this screening done every 3 years, beginning at age 51.  For women ages 92-65, health care providers may recommend pelvic exams and Pap testing every 3 years, or they may recommend the Pap and pelvic exam, combined with testing for human papilloma virus (HPV), every 5 years. Some types of HPV increase your risk of cervical cancer. Testing for HPV may also be done on women of any age with unclear Pap test results.  Other health care providers may not recommend any screening for nonpregnant women who are considered low risk for pelvic cancer and who do not have symptoms. Ask your health care provider if a screening pelvic exam is right for you.  If you have had past treatment for cervical cancer or a condition that could lead to cancer, you need Pap tests and screening for cancer for at least 20 years after your treatment. If Pap tests have been discontinued, your risk factors (such as having a new sexual partner) need to be reassessed to determine if screening should resume. Some women have medical problems that increase the chance of getting cervical cancer. In these cases, your health care provider may recommend more frequent screening and Pap tests.  Colorectal Cancer  This type of cancer can be detected and often prevented.  Routine colorectal cancer screening usually begins at 34 years of age and continues through 34 years of age.  Your health care provider may recommend screening at an earlier age if you have risk factors for colon cancer.  Your health care provider may also recommend using  home test kits to check for hidden blood in the stool.  A small camera at the end of a tube can be used to examine your colon directly (sigmoidoscopy or colonoscopy). This is done to check for the earliest forms of colorectal cancer.  Routine screening usually begins at age 28.  Direct examination of the colon should be repeated every 5-10 years through 34 years of age. However, you may need to be screened more often if early forms of precancerous polyps or small growths are found.  Skin Cancer  Check your skin from head to toe regularly.  Tell your health care provider about any new moles or changes in moles, especially if there is a change in a mole's shape or color.  Also tell your health care provider if you have a mole that is larger than the size of a pencil eraser.  Always use sunscreen. Apply sunscreen liberally and repeatedly throughout the day.  Protect yourself by wearing long  sleeves, pants, a wide-brimmed hat, and sunglasses whenever you are outside.  Heart disease, diabetes, and high blood pressure  High blood pressure causes heart disease and increases the risk of stroke. High blood pressure is more likely to develop in: ? People who have blood pressure in the high end of the normal range (130-139/85-89 mm Hg). ? People who are overweight or obese. ? People who are African American.  If you are 1-31 years of age, have your blood pressure checked every 3-5 years. If you are 58 years of age or older, have your blood pressure checked every year. You should have your blood pressure measured twice-once when you are at a hospital or clinic, and once when you are not at a hospital or clinic. Record the average of the two measurements. To check your blood pressure when you are not at a hospital or clinic, you can use: ? An automated blood pressure machine at a pharmacy. ? A home blood pressure monitor.  If you are between 49 years and 71 years old, ask your health care provider  if you should take aspirin to prevent strokes.  Have regular diabetes screenings. This involves taking a blood sample to check your fasting blood sugar level. ? If you are at a normal weight and have a low risk for diabetes, have this test once every three years after 34 years of age. ? If you are overweight and have a high risk for diabetes, consider being tested at a younger age or more often. Preventing infection Hepatitis B  If you have a higher risk for hepatitis B, you should be screened for this virus. You are considered at high risk for hepatitis B if: ? You were born in a country where hepatitis B is common. Ask your health care provider which countries are considered high risk. ? Your parents were born in a high-risk country, and you have not been immunized against hepatitis B (hepatitis B vaccine). ? You have HIV or AIDS. ? You use needles to inject street drugs. ? You live with someone who has hepatitis B. ? You have had sex with someone who has hepatitis B. ? You get hemodialysis treatment. ? You take certain medicines for conditions, including cancer, organ transplantation, and autoimmune conditions.  Hepatitis C  Blood testing is recommended for: ? Everyone born from [birth year] through 1963. ? Anyone with known risk factors for hepatitis C.  Sexually transmitted infections (STIs)  You should be screened for sexually transmitted infections (STIs) including gonorrhea and chlamydia if: ? You are sexually active and are younger than 34 years of age. ? You are older than 34 years of age and your health care provider tells you that you are at risk for this type of infection. ? Your sexual activity has changed since you were last screened and you are at an increased risk for chlamydia or gonorrhea. Ask your health care provider if you are at risk.  If you do not have HIV, but are at risk, it may be recommended that you take a prescription medicine daily to prevent HIV infection. This  is called pre-exposure prophylaxis (PrEP). You are considered at risk if: ? You are sexually active and do not regularly use condoms or know the HIV status of your partner(s). ? You take drugs by injection. ? You are sexually active with a partner who has HIV.  Talk with your health care provider about whether you are at high risk of being infected with HIV. If  you choose to begin PrEP, you should first be tested for HIV. You should then be tested every 3 months for as long as you are taking PrEP. Pregnancy  If you are premenopausal and you may become pregnant, ask your health care provider about preconception counseling.  If you may become pregnant, take 400 to 800 micrograms (mcg) of folic acid every day.  If you want to prevent pregnancy, talk to your health care provider about birth control (contraception). Osteoporosis and menopause  Osteoporosis is a disease in which the bones lose minerals and strength with aging. This can result in serious bone fractures. Your risk for osteoporosis can be identified using a bone density scan.  If you are 35 years of age or older, or if you are at risk for osteoporosis and fractures, ask your health care provider if you should be screened.  Ask your health care provider whether you should take a calcium or vitamin D supplement to lower your risk for osteoporosis.  Menopause may have certain physical symptoms and risks.  Hormone replacement therapy may reduce some of these symptoms and risks. Talk to your health care provider about whether hormone replacement therapy is right for you. Follow these instructions at home:  Schedule regular health, dental, and eye exams.  Stay current with your immunizations.  Do not use any tobacco products including cigarettes, chewing tobacco, or electronic cigarettes.  If you are pregnant, do not drink alcohol.  If you are breastfeeding, limit how much and how often you drink alcohol.  Limit alcohol intake  to no more than 1 drink per day for nonpregnant women. One drink equals 12 ounces of beer, 5 ounces of wine, or 1 ounces of hard liquor.  Do not use street drugs.  Do not share needles.  Ask your health care provider for help if you need support or information about quitting drugs.  Tell your health care provider if you often feel depressed.  Tell your health care provider if you have ever been abused or do not feel safe at home. This information is not intended to replace advice given to you by your health care provider. Make sure you discuss any questions you have with your health care provider. Document Released: 06/01/2011 Document Revised: 04/23/2016 Document Reviewed: 08/20/2015 Elsevier Interactive Patient Education  [employer].

## 2017-07-08 NOTE — Progress Notes (Signed)
Patient presents to clinic today to establish care. She is a pleasant 36 year female who  has a past medical history of Anemia; Kidney infection (2011); and Vaginal Pap smear, abnormal.   Acute Concerns: Establish Care  Chronic Issues: None   Health Maintenance: Dental -- Routine Care Vision -- Routine Care Immunizations -- UTD PAP -- 2016 - Normal.  Diet: She eats a heart healthy diet Exercise: is training for a half marathon   Is followed by   GYN - Dr. Ouida Sills   Past Medical History:  Diagnosis Date  . Anemia   . Kidney infection 2011  . Vaginal Pap smear, abnormal     Past Surgical History:  Procedure Laterality Date  . EYE SURGERY    . LEEP  2002    No current outpatient prescriptions on file prior to visit.   No current facility-administered medications on file prior to visit.     Allergies  Allergen Reactions  . Amoxicillin Hives  . Penicillin G Hives    Family History  Problem Relation Age of Onset  . Diabetes Mother   . Arthritis Mother   . Hypertension Mother   . Diabetes Maternal Grandmother   . Alcohol abuse Maternal Grandfather   . Dementia Maternal Grandfather   . Heart disease Maternal Grandfather   . Stroke Paternal Grandmother   . Alcohol abuse Maternal Uncle     Social History   Social History  . Marital status: Married    Spouse name: N/A  . Number of children: N/A  . Years of education: N/A   Occupational History  . Not on file.   Social History Main Topics  . Smoking status: Never Smoker  . Smokeless tobacco: Never Used  . Alcohol use No  . Drug use: No  . Sexual activity: Yes    Partners: Male    Birth control/ protection: Injection   Other Topics Concern  . Not on file   Social History Narrative  . No narrative on file    Review of Systems  Constitutional: Negative.   Eyes: Negative.   Respiratory: Negative.   Cardiovascular: Negative.   Gastrointestinal: Negative.   Genitourinary: Negative.     Musculoskeletal: Negative.   Skin: Negative.   Neurological: Negative.   Psychiatric/Behavioral: Negative.   All other systems reviewed and are negative.   BP 94/60 (BP Location: Left Arm)   Temp 98.4 F (36.9 C) (Oral)   Ht 5' 4.75" (1.645 m)   Wt 163 lb (73.9 kg)   LMP 06/30/2017 (Within Days)   BMI 27.33 kg/m   Physical Exam  Constitutional: She is oriented to person, place, and time and well-developed, well-nourished, and in no distress. No distress.  Cardiovascular: Normal rate, regular rhythm, normal heart sounds and intact distal pulses.  Exam reveals no gallop and no friction rub.   No murmur heard. Pulmonary/Chest: Effort normal and breath sounds normal. No respiratory distress. She has no wheezes. She has no rales. She exhibits no tenderness.  Neurological: She is alert and oriented to person, place, and time. Gait normal. GCS score is 15.  Skin: Skin is warm and dry. No rash noted. She is not diaphoretic. No erythema. No pallor.  Psychiatric: Mood, memory, affect and judgment normal.  Nursing note and vitals reviewed.  Assessment/Plan: 1. Routine general medical examination at a health care facility - Continue to eat healthy and exercise  - Follow up yearly unless an acute issue arises.  - CBC with Differential/Platelet -  Basic metabolic panel - Hemoglobin A1c - Hepatic function panel - Lipid panel - TSH  Dorothyann Peng, NP

## 2017-07-30 ENCOUNTER — Other Ambulatory Visit: Payer: BLUE CROSS/BLUE SHIELD

## 2017-07-30 LAB — LIPID PANEL
CHOLESTEROL: 158 mg/dL (ref 0–200)
HDL: 55.2 mg/dL (ref >39.00)
LDL Cholesterol: 88 mg/dL (ref 0–99)
NonHDL: 102.96
Total CHOL/HDL Ratio: 3
Triglycerides: 74 mg/dL (ref 0.0–149.0)
VLDL: 14.8 mg/dL (ref 0.0–40.0)

## 2017-07-30 LAB — CBC WITH DIFFERENTIAL/PLATELET
BASOS ABS: 0 10*3/uL (ref 0.0–0.1)
Basophils Relative: 0.6 % (ref 0.0–3.0)
EOS PCT: 1.7 % (ref 0.0–5.0)
Eosinophils Absolute: 0.1 10*3/uL (ref 0.0–0.7)
HEMATOCRIT: 37.9 % (ref 36.0–46.0)
Hemoglobin: 12.3 g/dL (ref 12.0–15.0)
LYMPHS PCT: 27.1 % (ref 12.0–46.0)
Lymphs Abs: 1.7 10*3/uL (ref 0.7–4.0)
MCHC: 32.4 g/dL (ref 30.0–36.0)
MCV: 87.6 fl (ref 78.0–100.0)
MONOS PCT: 8.2 % (ref 3.0–12.0)
Monocytes Absolute: 0.5 10*3/uL (ref 0.1–1.0)
NEUTROS ABS: 3.8 10*3/uL (ref 1.4–7.7)
Neutrophils Relative %: 62.4 % (ref 43.0–77.0)
PLATELETS: 269 10*3/uL (ref 150.0–400.0)
RBC: 4.32 Mil/uL (ref 3.87–5.11)
RDW: 14.2 % (ref 11.5–15.5)
WBC: 6.1 10*3/uL (ref 4.0–10.5)

## 2017-07-30 LAB — BASIC METABOLIC PANEL
BUN: 11 mg/dL (ref 6–23)
CO2: 29 meq/L (ref 19–32)
CREATININE: 0.78 mg/dL (ref 0.40–1.20)
Calcium: 9.7 mg/dL (ref 8.4–10.5)
Chloride: 103 mEq/L (ref 96–112)
GFR: 108.63 mL/min (ref >60.00)
Glucose, Bld: 86 mg/dL (ref 70–99)
Potassium: 4.5 mEq/L (ref 3.5–5.1)
Sodium: 137 mEq/L (ref 135–145)

## 2017-07-30 LAB — HEMOGLOBIN A1C: Hgb A1c MFr Bld: 5.4 % (ref 4.6–6.5)

## 2017-07-30 LAB — HEPATIC FUNCTION PANEL
ALBUMIN: 4.3 g/dL (ref 3.5–5.2)
ALT: 20 U/L (ref 0–35)
AST: 13 U/L (ref 0–37)
Alkaline Phosphatase: 63 U/L (ref 39–117)
Bilirubin, Direct: 0.1 mg/dL (ref 0.0–0.3)
Total Bilirubin: 0.6 mg/dL (ref 0.2–1.2)
Total Protein: 6.6 g/dL (ref 6.0–8.3)

## 2017-07-30 LAB — TSH: TSH: 1.81 u[IU]/mL (ref 0.35–4.50)

## 2017-08-20 ENCOUNTER — Encounter: Payer: Self-pay | Admitting: Adult Health

## 2017-10-01 ENCOUNTER — Encounter (INDEPENDENT_AMBULATORY_CARE_PROVIDER_SITE_OTHER): Payer: Self-pay

## 2017-10-01 ENCOUNTER — Ambulatory Visit (INDEPENDENT_AMBULATORY_CARE_PROVIDER_SITE_OTHER): Payer: BLUE CROSS/BLUE SHIELD | Admitting: Ophthalmology

## 2017-10-06 ENCOUNTER — Encounter (INDEPENDENT_AMBULATORY_CARE_PROVIDER_SITE_OTHER): Payer: BLUE CROSS/BLUE SHIELD | Admitting: Ophthalmology

## 2017-10-06 DIAGNOSIS — H43813 Vitreous degeneration, bilateral: Secondary | ICD-10-CM | POA: Diagnosis not present

## 2017-10-06 DIAGNOSIS — D3131 Benign neoplasm of right choroid: Secondary | ICD-10-CM | POA: Diagnosis not present

## 2017-10-06 DIAGNOSIS — H3509 Other intraretinal microvascular abnormalities: Secondary | ICD-10-CM

## 2017-10-06 DIAGNOSIS — H2512 Age-related nuclear cataract, left eye: Secondary | ICD-10-CM

## 2017-10-07 ENCOUNTER — Encounter (INDEPENDENT_AMBULATORY_CARE_PROVIDER_SITE_OTHER): Payer: BLUE CROSS/BLUE SHIELD | Admitting: Ophthalmology

## 2017-10-08 DIAGNOSIS — Z01419 Encounter for gynecological examination (general) (routine) without abnormal findings: Secondary | ICD-10-CM | POA: Diagnosis not present

## 2017-10-08 DIAGNOSIS — Z6828 Body mass index (BMI) 28.0-28.9, adult: Secondary | ICD-10-CM | POA: Diagnosis not present

## 2017-10-08 DIAGNOSIS — Z1151 Encounter for screening for human papillomavirus (HPV): Secondary | ICD-10-CM | POA: Diagnosis not present

## 2018-02-16 DIAGNOSIS — J3089 Other allergic rhinitis: Secondary | ICD-10-CM | POA: Diagnosis not present

## 2018-02-16 DIAGNOSIS — N943 Premenstrual tension syndrome: Secondary | ICD-10-CM | POA: Diagnosis not present

## 2018-05-13 DIAGNOSIS — N943 Premenstrual tension syndrome: Secondary | ICD-10-CM | POA: Diagnosis not present

## 2018-05-13 DIAGNOSIS — J3089 Other allergic rhinitis: Secondary | ICD-10-CM | POA: Diagnosis not present

## 2018-07-04 DIAGNOSIS — R21 Rash and other nonspecific skin eruption: Secondary | ICD-10-CM | POA: Diagnosis not present

## 2018-07-04 DIAGNOSIS — Z0189 Encounter for other specified special examinations: Secondary | ICD-10-CM | POA: Diagnosis not present

## 2018-07-06 DIAGNOSIS — Z0000 Encounter for general adult medical examination without abnormal findings: Secondary | ICD-10-CM | POA: Diagnosis not present

## 2018-07-06 DIAGNOSIS — R21 Rash and other nonspecific skin eruption: Secondary | ICD-10-CM | POA: Diagnosis not present

## 2018-07-06 DIAGNOSIS — Z0189 Encounter for other specified special examinations: Secondary | ICD-10-CM | POA: Diagnosis not present

## 2018-08-19 DIAGNOSIS — J3089 Other allergic rhinitis: Secondary | ICD-10-CM | POA: Diagnosis not present

## 2018-08-19 DIAGNOSIS — N943 Premenstrual tension syndrome: Secondary | ICD-10-CM | POA: Diagnosis not present

## 2018-08-19 DIAGNOSIS — B009 Herpesviral infection, unspecified: Secondary | ICD-10-CM | POA: Diagnosis not present

## 2018-08-31 DIAGNOSIS — Z008 Encounter for other general examination: Secondary | ICD-10-CM | POA: Diagnosis not present

## 2018-08-31 DIAGNOSIS — N943 Premenstrual tension syndrome: Secondary | ICD-10-CM | POA: Diagnosis not present

## 2018-08-31 DIAGNOSIS — E663 Overweight: Secondary | ICD-10-CM | POA: Diagnosis not present

## 2018-09-25 DIAGNOSIS — R0789 Other chest pain: Secondary | ICD-10-CM | POA: Diagnosis not present

## 2018-09-30 DIAGNOSIS — Z008 Encounter for other general examination: Secondary | ICD-10-CM | POA: Diagnosis not present

## 2018-09-30 DIAGNOSIS — E663 Overweight: Secondary | ICD-10-CM | POA: Diagnosis not present

## 2018-10-06 ENCOUNTER — Encounter (INDEPENDENT_AMBULATORY_CARE_PROVIDER_SITE_OTHER): Payer: BLUE CROSS/BLUE SHIELD | Admitting: Ophthalmology

## 2018-10-13 ENCOUNTER — Encounter (INDEPENDENT_AMBULATORY_CARE_PROVIDER_SITE_OTHER): Payer: BLUE CROSS/BLUE SHIELD | Admitting: Ophthalmology

## 2018-10-13 DIAGNOSIS — I10 Essential (primary) hypertension: Secondary | ICD-10-CM | POA: Diagnosis not present

## 2018-10-13 DIAGNOSIS — H2512 Age-related nuclear cataract, left eye: Secondary | ICD-10-CM

## 2018-10-13 DIAGNOSIS — H35033 Hypertensive retinopathy, bilateral: Secondary | ICD-10-CM | POA: Diagnosis not present

## 2018-10-13 DIAGNOSIS — H338 Other retinal detachments: Secondary | ICD-10-CM | POA: Diagnosis not present

## 2018-10-13 DIAGNOSIS — H43813 Vitreous degeneration, bilateral: Secondary | ICD-10-CM | POA: Diagnosis not present

## 2018-11-03 DIAGNOSIS — N943 Premenstrual tension syndrome: Secondary | ICD-10-CM | POA: Diagnosis not present

## 2018-11-03 DIAGNOSIS — E663 Overweight: Secondary | ICD-10-CM | POA: Diagnosis not present

## 2018-11-03 DIAGNOSIS — Z008 Encounter for other general examination: Secondary | ICD-10-CM | POA: Diagnosis not present

## 2018-11-06 ENCOUNTER — Emergency Department (HOSPITAL_COMMUNITY): Payer: BLUE CROSS/BLUE SHIELD

## 2018-11-06 ENCOUNTER — Encounter (HOSPITAL_COMMUNITY): Payer: Self-pay

## 2018-11-06 ENCOUNTER — Other Ambulatory Visit: Payer: Self-pay

## 2018-11-06 ENCOUNTER — Emergency Department (HOSPITAL_COMMUNITY)
Admission: EM | Admit: 2018-11-06 | Discharge: 2018-11-06 | Disposition: A | Discharge status: Home / Self Care | Payer: BLUE CROSS/BLUE SHIELD | Attending: Emergency Medicine | Admitting: Emergency Medicine

## 2018-11-06 DIAGNOSIS — R079 Chest pain, unspecified: Secondary | ICD-10-CM

## 2018-11-06 DIAGNOSIS — R0789 Other chest pain: Secondary | ICD-10-CM | POA: Diagnosis not present

## 2018-11-06 DIAGNOSIS — Z79899 Other long term (current) drug therapy: Secondary | ICD-10-CM | POA: Diagnosis not present

## 2018-11-06 DIAGNOSIS — R7989 Other specified abnormal findings of blood chemistry: Secondary | ICD-10-CM | POA: Diagnosis not present

## 2018-11-06 LAB — I-STAT BETA HCG BLOOD, ED (MC, WL, AP ONLY): I-stat hCG, quantitative: <5 m[IU]/mL (ref <5)

## 2018-11-06 LAB — CBC
HCT: 39 % (ref 36.0–46.0)
Hemoglobin: 11.7 g/dL — ABNORMAL LOW (ref 12.0–15.0)
MCH: 26.7 pg (ref 26.0–34.0)
MCHC: 30 g/dL (ref 30.0–36.0)
MCV: 89 fL (ref 80.0–100.0)
PLATELETS: 334 10*3/uL (ref 150–400)
RBC: 4.38 MIL/uL (ref 3.87–5.11)
RDW: 13.1 % (ref 11.5–15.5)
WBC: 14.2 10*3/uL — ABNORMAL HIGH (ref 4.0–10.5)
nRBC: 0 % (ref 0.0–0.2)

## 2018-11-06 LAB — COMPREHENSIVE METABOLIC PANEL
ALT: 15 U/L (ref 0–44)
AST: 16 U/L (ref 15–41)
Albumin: 4.1 g/dL (ref 3.5–5.0)
Alkaline Phosphatase: 65 U/L (ref 38–126)
Anion gap: 10 (ref 5–15)
BUN: 7 mg/dL (ref 6–20)
CALCIUM: 9.2 mg/dL (ref 8.9–10.3)
CO2: 25 mmol/L (ref 22–32)
Chloride: 103 mmol/L (ref 98–111)
Creatinine, Ser: 0.82 mg/dL (ref 0.44–1.00)
Glucose, Bld: 91 mg/dL (ref 70–99)
Potassium: 3.8 mmol/L (ref 3.5–5.1)
Sodium: 138 mmol/L (ref 135–145)
Total Bilirubin: 0.6 mg/dL (ref 0.3–1.2)
Total Protein: 7 g/dL (ref 6.5–8.1)

## 2018-11-06 LAB — I-STAT TROPONIN, ED: Troponin i, poc: 0 ng/mL (ref 0.00–0.08)

## 2018-11-06 LAB — D-DIMER, QUANTITATIVE: D-Dimer, Quant: 0.92 ug/mL-FEU — ABNORMAL HIGH (ref 0.00–0.50)

## 2018-11-06 MED ORDER — IOPAMIDOL (ISOVUE-370) INJECTION 76%
100.0000 mL | Freq: Once | INTRAVENOUS | Status: AC | PRN
  Administered 2018-11-06: 100 mL via INTRAVENOUS

## 2018-11-06 MED ORDER — NAPROXEN 250 MG PO TABS
500.0000 mg | ORAL_TABLET | Freq: Once | ORAL | Status: AC
  Administered 2018-11-06: 500 mg via ORAL
  Filled 2018-11-06: qty 2

## 2018-11-06 MED ORDER — NAPROXEN 500 MG PO TABS: 500.0000 mg | ORAL_TABLET | Freq: Two times a day (BID) | ORAL | 0 refills | Status: DC

## 2018-11-06 MED ORDER — PANTOPRAZOLE SODIUM 40 MG PO TBEC: 40.0000 mg | DELAYED_RELEASE_TABLET | Freq: Every day | ORAL | 0 refills | Status: DC

## 2018-11-06 MED ORDER — PANTOPRAZOLE SODIUM 40 MG PO TBEC
40.0000 mg | DELAYED_RELEASE_TABLET | Freq: Once | ORAL | Status: AC
  Administered 2018-11-06: 40 mg via ORAL
  Filled 2018-11-06: qty 1

## 2018-11-06 NOTE — ED Provider Notes (Signed)
[facility] Provider Note   CSN: [id number] Arrival date & time: 11/06/18  1413     History   Chief Complaint Chief Complaint  Patient presents with  . Chest Pain    HPI Yvette Moore is a 37 y.o. female.  HPI Patient presented to the emergency room for evaluation of chest pain.  Patient states this episode started a couple days ago.  Patient initially noticed it after trying to swallow a pill.  She felt like it somewhat got stuck in her throat and after she swallows that she has to spit up mucus for several minutes.  Patient states she has pain in the center of her chest that radiates down towards her abdomen.  It increases with deep breathing and swallowing.  Certain movements and positions also increase the pain.  She denies any fevers or chills.  No cough.  No leg swelling.  She does not have any history of PE or DVT.  No history of heart disease.  Past Medical History:  Diagnosis Date  . Anemia   . Coat's disease   . Gestational diabetes   . Kidney infection 2011  . Vaginal Pap smear, abnormal     Patient Active Problem List   Diagnosis Date Noted  . Active labor 03/20/2014  . Normal delivery 03/20/2014    Past Surgical History:  Procedure Laterality Date  . EYE SURGERY    . LEEP  2002     OB History    Gravida  4   Para  3   Term  3   Preterm      AB  1   Living  3     SAB  1   TAB      Ectopic      Multiple  0   Live Births  3            Home Medications    Prior to Admission medications   Medication Sig Start Date End Date Taking? Authorizing Provider  GARLIC PO Take by mouth.    [provider]  MULTIPLE VITAMIN PO Take 1 tablet by mouth daily.    [provider]  naproxen (NAPROSYN) 500 MG tablet Take 1 tablet (500 mg total) by mouth 2 (two) times daily with a meal. As needed for pain 11/06/18   Dorie Rank, MD  pantoprazole (PROTONIX) 40 MG tablet Take 1 tablet (40 mg  total) by mouth daily. 11/06/18   Dorie Rank, MD    Family History Family History  Problem Relation Age of Onset  . Diabetes Mother   . Arthritis Mother   . Hypertension Mother   . Diabetes Maternal Grandmother   . Alcohol abuse Maternal Grandfather   . Dementia Maternal Grandfather   . Heart disease Maternal Grandfather   . Stroke Paternal Grandmother   . Breast cancer Paternal Grandmother   . Alcohol abuse Maternal Uncle     Social History Social History   Tobacco Use  . Smoking status: Never Smoker  . Smokeless tobacco: Never Used  Substance Use Topics  . Alcohol use: No  . Drug use: No     Allergies   Amoxicillin and Penicillin g   Review of Systems Review of Systems  All other systems reviewed and are negative.    Physical Exam Updated Vital Signs BP 108/71 (BP Location: Right Arm)   Pulse 84   Temp 98.7 F (37.1 C) (Oral)   Resp 18  Ht 1.651 m (5\' 5" )   Wt 74.4 kg   LMP 10/24/2018 (Approximate)   SpO2 100%   BMI 27.29 kg/m   Physical Exam  Constitutional: She appears well-developed and well-nourished. No distress.  HENT:  Head: Normocephalic and atraumatic.  Right Ear: External ear normal.  Left Ear: External ear normal.  Eyes: Conjunctivae are normal. Right eye exhibits no discharge. Left eye exhibits no discharge. No scleral icterus.  Neck: Neck supple. No tracheal deviation present.  Cardiovascular: Normal rate, regular rhythm and intact distal pulses.  Pulmonary/Chest: Effort normal and breath sounds normal. No stridor. No respiratory distress. She has no wheezes. She has no rales.  Abdominal: Soft. Bowel sounds are normal. She exhibits no distension. There is no tenderness. There is no rebound and no guarding.  Musculoskeletal: She exhibits no edema or tenderness.  Neurological: She is alert. She has normal strength. No cranial nerve deficit (no facial droop, extraocular movements intact, no slurred speech) or sensory deficit. She exhibits  normal muscle tone. She displays no seizure activity. Coordination normal.  Skin: Skin is warm and dry. No rash noted.  Psychiatric: She has a normal mood and affect.  Nursing note and vitals reviewed.    ED Treatments / Results  Labs (all labs ordered are listed, but only abnormal results are displayed) Labs Reviewed  CBC - Abnormal; Notable for the following components:      Result Value   WBC 14.2 (*)    Hemoglobin 11.7 (*)    All other components within normal limits  D-DIMER, QUANTITATIVE (NOT AT [facility]) - Abnormal; Notable for the following components:   D-Dimer, Quant 0.92 (*)    All other components within normal limits  COMPREHENSIVE METABOLIC PANEL  I-STAT TROPONIN, ED  I-STAT BETA HCG BLOOD, ED ([facility], [facility], [facility] ONLY)    EKG EKG Interpretation  Date/Time:  Sunday November 06 2018 15:25:21 EST Ventricular Rate:  86 PR Interval:  164 QRS Duration: 72 QT Interval:  350 QTC Calculation: 418 R Axis:   97 Text Interpretation:  Sinus rhythm with marked sinus arrhythmia Rightward axis Septal infarct , age undetermined Abnormal ECG nonspecific t wave changes compared to prior ECG Confirmed by Dorie Rank [phone]) on 11/06/2018 2:34:03 PM   Radiology Dg Chest 2 View  Result Date: 11/06/2018 CLINICAL DATA:  Chest pain x1 week EXAM: CHEST - 2 VIEW COMPARISON:  03/19/2017 FINDINGS: Lungs are clear.  No pleural effusion or pneumothorax. The heart is normal in size. Visualized osseous structures are within normal limits. IMPRESSION: Normal chest radiographs. Electronically Signed   By: Julian Hy M.D.   On: 11/06/2018 15:55   Ct Angio Chest Pe W And/or Wo Contrast  Result Date: 11/06/2018 CLINICAL DATA:  Left chest pain x3 days, elevated D-dimer EXAM: CT ANGIOGRAPHY CHEST WITH CONTRAST TECHNIQUE: Multidetector CT imaging of the chest was performed using the standard protocol during bolus administration of intravenous contrast. Multiplanar CT image reconstructions and MIPs were  obtained to evaluate the vascular anatomy. CONTRAST:  140mL ISOVUE-370 IOPAMIDOL (ISOVUE-370) INJECTION 76% COMPARISON:  Chest radiographs dated 11/06/2018 FINDINGS: Cardiovascular: Satisfactory opacification of the pulmonary arteries to the segmental level. No evidence of pulmonary embolism. No evidence of thoracic aortic aneurysm. The heart is normal in size. Mediastinum/Nodes: No suspicious mediastinal lymphadenopathy. Visualized thyroid is unremarkable. Lungs/Pleura: Lungs are clear. No suspicious pulmonary nodules. No focal consolidation. No pleural effusion or pneumothorax. Upper Abdomen: Visualized upper abdomen is unremarkable. Musculoskeletal: Visualized osseous structures are within normal limits. Review of the MIP  images confirms the above findings. IMPRESSION: No evidence of pulmonary embolism. Normal CT chest. Electronically Signed   By: Julian Hy M.D.   On: 11/06/2018 16:48    Procedures Procedures (including critical care time)  Medications Ordered in ED Medications  pantoprazole (PROTONIX) EC tablet 40 mg (has no administration in time range)  naproxen (NAPROSYN) tablet 500 mg (has no administration in time range)  iopamidol (ISOVUE-370) 76 % injection 100 mL (100 mLs Intravenous Contrast Given 11/06/18 1615)     Initial Impression / Assessment and Plan / ED Course  I have reviewed the triage vital signs and the nursing notes.  Pertinent labs & imaging results that were available during my care of the patient were reviewed by me and considered in my medical decision making (see chart for details).  Clinical Course as of Nov 06 1748  Nancy Fetter Nov 06, 2018  1533 D dimer elevated   [JK]    Clinical Course User Index [JK] Dorie Rank, MD    Patient's ED work-up is reassuring.  Troponin is normal.  Electrolyte panel chest x-ray were normal.  D-dimer was elevated so a CT angiogram was ordered.  CT does not show evidence of PE or other acute abnormality.  She symptoms may be  related to esophageal irritation considering her swallowing symptoms.  Symptoms could also be related to pleurisy or chest wall inflammation.  Plan on discharge home with a course of Protonix and Naprosyn.  Discussed outpatient follow-up.  At this time there does not appear to be any evidence of an acute emergency medical condition and the patient appears stable for discharge with appropriate outpatient follow up.   Final Clinical Impressions(s) / ED Diagnoses   Final diagnoses:  Chest pain, unspecified type    ED Discharge Orders         Ordered    pantoprazole (PROTONIX) 40 MG tablet  Daily     11/06/18 1748    naproxen (NAPROSYN) 500 MG tablet  2 times daily with meals     11/06/18 1748           Dorie Rank, MD 11/06/18 1750

## 2018-11-06 NOTE — ED Triage Notes (Signed)
Pt started having chest pain after taking zolft pill. Pt states it feels that the pill gets stuck in her throat and she has to throw it back up this began on friday

## 2018-11-06 NOTE — Discharge Instructions (Signed)
Take the medications as prescribed, follow up with your doctor next week to be rechecked

## 2018-11-10 ENCOUNTER — Ambulatory Visit: Payer: BLUE CROSS/BLUE SHIELD | Admitting: Adult Health

## 2018-11-10 ENCOUNTER — Encounter: Payer: Self-pay | Admitting: Adult Health

## 2018-11-10 VITALS — BP 110/70 | Temp 98.8°F | Wt 171.0 lb

## 2018-11-10 DIAGNOSIS — K2100 Gastro-esophageal reflux disease with esophagitis, without bleeding: Secondary | ICD-10-CM

## 2018-11-10 DIAGNOSIS — Z23 Encounter for immunization: Secondary | ICD-10-CM | POA: Diagnosis not present

## 2018-11-10 MED ORDER — PANTOPRAZOLE SODIUM 40 MG PO TBEC: 40.0000 mg | DELAYED_RELEASE_TABLET | Freq: Every day | ORAL | 0 refills | Status: DC

## 2018-11-10 NOTE — Progress Notes (Signed)
Subjective:    Patient ID: Yvette Moore, female    DOB: [birth year], 37 y.o.   MRN: [id number]  HPI 37 year old female who  has a past medical history of Anemia, Coat's disease, Gestational diabetes, Kidney infection (2011), and Vaginal Pap smear, abnormal.  She presents to the office today for follow up after recent ER visit for chest pain. She was seen 4 days ago for mid sternal chest pain that radiated to her abdomen. The episode started 2-3 days ago before presenting to the ER. She initially noticed the pain after trying to swallow a pill and it felt as though the pill got stuck in her throat.   Her workup in the ER was reassuring. Troponin was negative. BMP and chest xray were normal. D dimer was elevated so a CT angio was ordered which did not show evidence of PE or other acute abnormality. It was thought that her symptoms may be related to esophageal irritation but may also be related to pleurisy or chest wall inflammation.   She was prescribed Naprosyn and Protonix   Today in the office she reports that she is feeling improved. She had one episode of abdominal pain after she forgot to take her two prescribed medications. This was after she took Zoloft.   She denies feeling as though she is waking up with a sour taste in her mouth but does have nausea and excessive saliva production.   No issues with swallowing foods.   Review of Systems See HPI   Past Medical History:  Diagnosis Date  . Anemia   . Coat's disease   . Gestational diabetes   . Kidney infection 2011  . Vaginal Pap smear, abnormal     Social History   Socioeconomic History  . Marital status: Married    Spouse name: Not on file  . Number of children: Not on file  . Years of education: Not on file  . Highest education level: Not on file  Occupational History  . Not on file  Social Needs  . Financial resource strain: Not on file  . Food insecurity:    Worry: Not on file    Inability: Not on file  .  Transportation needs:    Medical: Not on file    Non-medical: Not on file  Tobacco Use  . Smoking status: Never Smoker  . Smokeless tobacco: Never Used  Substance and Sexual Activity  . Alcohol use: No  . Drug use: No  . Sexual activity: Yes    Partners: Male    Birth control/protection: Injection  Lifestyle  . Physical activity:    Days per week: Not on file    Minutes per session: Not on file  . Stress: Not on file  Relationships  . Social connections:    Talks on phone: Not on file    Gets together: Not on file    Attends religious service: Not on file    Active member of club or organization: Not on file    Attends meetings of clubs or organizations: Not on file    Relationship status: Not on file  . Intimate partner violence:    Fear of current or ex partner: Not on file    Emotionally abused: Not on file    Physically abused: Not on file    Forced sexual activity: Not on file  Other Topics Concern  . Not on file  Social History Narrative   She works at [employer]  Married for 5 years    Three child ( 12, 3 7 18  months)       She likes to run       Past Surgical History:  Procedure Laterality Date  . EYE SURGERY    . LEEP  2002    Family History  Problem Relation Age of Onset  . Diabetes Mother   . Arthritis Mother   . Hypertension Mother   . Diabetes Maternal Grandmother   . Alcohol abuse Maternal Grandfather   . Dementia Maternal Grandfather   . Heart disease Maternal Grandfather   . Stroke Paternal Grandmother   . Breast cancer Paternal Grandmother   . Alcohol abuse Maternal Uncle     Allergies  Allergen Reactions  . Amoxicillin Hives  . Penicillin G Hives    Current Outpatient Medications on File Prior to Visit  Medication Sig Dispense Refill  . sertraline (ZOLOFT) 100 MG tablet Take 1 tablet by mouth at bedtime.  2   No current facility-administered medications on file prior to visit.     BP 110/70   Temp 98.8 F (37.1 C)   Wt 171 lb  (77.6 kg)   LMP 10/24/2018 (Approximate)   BMI 28.46 kg/m       Objective:   Physical Exam Vitals signs and nursing note reviewed.  Constitutional:      Appearance: Normal appearance.  Cardiovascular:     Rate and Rhythm: Normal rate and regular rhythm.  Pulmonary:     Effort: Pulmonary effort is normal.     Breath sounds: Normal breath sounds.  Abdominal:     General: There is no distension.     Palpations: Abdomen is soft. There is no mass.     Tenderness: There is no abdominal tenderness.  Skin:    General: Skin is warm and dry.     Capillary Refill: Capillary refill takes less than 2 seconds.  Neurological:     Mental Status: She is alert.       Assessment & Plan:  1. Gastroesophageal reflux disease with esophagitis - Will have her stop Naprosyn and stay on Protonix for one month and then stop  - Follow up as needed - Consider referral to GI if symptoms continue  - pantoprazole (PROTONIX) 40 MG tablet; Take 1 tablet (40 mg total) by mouth daily.  Dispense: 30 tablet; Refill: 0  2. Need for influenza vaccination  - Flu Vaccine QUAD 6+ mos PF IM (Fluarix Quad PF)  Dorothyann Peng, NP

## 2018-12-23 ENCOUNTER — Encounter: Payer: BLUE CROSS/BLUE SHIELD | Admitting: Adult Health

## 2018-12-23 DIAGNOSIS — Z0289 Encounter for other administrative examinations: Secondary | ICD-10-CM

## 2019-01-05 ENCOUNTER — Encounter: Payer: BLUE CROSS/BLUE SHIELD | Admitting: Adult Health

## 2019-01-27 ENCOUNTER — Encounter: Payer: BLUE CROSS/BLUE SHIELD | Admitting: Adult Health

## 2019-02-08 ENCOUNTER — Other Ambulatory Visit: Payer: Self-pay

## 2019-02-08 ENCOUNTER — Ambulatory Visit (INDEPENDENT_AMBULATORY_CARE_PROVIDER_SITE_OTHER): Payer: BLUE CROSS/BLUE SHIELD

## 2019-02-08 ENCOUNTER — Encounter: Payer: Self-pay | Admitting: Adult Health

## 2019-02-08 ENCOUNTER — Ambulatory Visit (INDEPENDENT_AMBULATORY_CARE_PROVIDER_SITE_OTHER): Payer: BLUE CROSS/BLUE SHIELD | Admitting: Adult Health

## 2019-02-08 VITALS — BP 100/74 | Temp 98.3°F | Ht 65.0 in | Wt 174.0 lb

## 2019-02-08 DIAGNOSIS — Z0000 Encounter for general adult medical examination without abnormal findings: Secondary | ICD-10-CM | POA: Diagnosis not present

## 2019-02-08 DIAGNOSIS — M542 Cervicalgia: Secondary | ICD-10-CM

## 2019-02-08 LAB — COMPREHENSIVE METABOLIC PANEL
ALT: 13 U/L (ref 0–35)
AST: 14 U/L (ref 0–37)
Albumin: 4.1 g/dL (ref 3.5–5.2)
Alkaline Phosphatase: 59 U/L (ref 39–117)
BUN: 8 mg/dL (ref 6–23)
CO2: 26 mEq/L (ref 19–32)
Calcium: 9 mg/dL (ref 8.4–10.5)
Chloride: 107 mEq/L (ref 96–112)
Creatinine, Ser: 0.77 mg/dL (ref 0.40–1.20)
GFR: 102.82 mL/min (ref >60.00)
Glucose, Bld: 78 mg/dL (ref 70–99)
POTASSIUM: 4.4 meq/L (ref 3.5–5.1)
SODIUM: 139 meq/L (ref 135–145)
Total Bilirubin: 0.5 mg/dL (ref 0.2–1.2)
Total Protein: 6.4 g/dL (ref 6.0–8.3)

## 2019-02-08 LAB — LIPID PANEL
Cholesterol: 177 mg/dL (ref 0–200)
HDL: 61.3 mg/dL (ref >39.00)
LDL Cholesterol: 101 mg/dL — ABNORMAL HIGH (ref 0–99)
NonHDL: 115.23
Total CHOL/HDL Ratio: 3
Triglycerides: 69 mg/dL (ref 0.0–149.0)
VLDL: 13.8 mg/dL (ref 0.0–40.0)

## 2019-02-08 LAB — CBC WITH DIFFERENTIAL/PLATELET
Basophils Absolute: 0 10*3/uL (ref 0.0–0.1)
Basophils Relative: 0.6 % (ref 0.0–3.0)
Eosinophils Absolute: 0.1 10*3/uL (ref 0.0–0.7)
Eosinophils Relative: 1.2 % (ref 0.0–5.0)
HCT: 32.3 % — ABNORMAL LOW (ref 36.0–46.0)
Hemoglobin: 10.8 g/dL — ABNORMAL LOW (ref 12.0–15.0)
Lymphocytes Relative: 20.6 % (ref 12.0–46.0)
Lymphs Abs: 1.1 10*3/uL (ref 0.7–4.0)
MCHC: 33.6 g/dL (ref 30.0–36.0)
MCV: 84.2 fl (ref 78.0–100.0)
Monocytes Absolute: 0.4 10*3/uL (ref 0.1–1.0)
Monocytes Relative: 8 % (ref 3.0–12.0)
Neutro Abs: 3.7 10*3/uL (ref 1.4–7.7)
Neutrophils Relative %: 69.6 % (ref 43.0–77.0)
Platelets: 299 10*3/uL (ref 150.0–400.0)
RBC: 3.84 Mil/uL — ABNORMAL LOW (ref 3.87–5.11)
RDW: 15.6 % — ABNORMAL HIGH (ref 11.5–15.5)
WBC: 5.4 10*3/uL (ref 4.0–10.5)

## 2019-02-08 LAB — TSH: TSH: 0.78 u[IU]/mL (ref 0.35–4.50)

## 2019-02-08 NOTE — Progress Notes (Signed)
Subjective:    Patient ID: Yvette Moore, female    DOB: Aug 03, 1981, 38 y.o.   MRN: [id number]  HPI Patient presents for yearly preventative medicine examination. She is a pleasant 38 year old female who  has a past medical history of Anemia, Coat's disease, Gestational diabetes, Kidney infection (2011), and Vaginal Pap smear, abnormal.  Anxiety and Depression - Takes Zoloft 100 mg   Coats Disease - followed by Opthalmology   GERD - was placed on one month of Protonix in December for GERD like symptoms. She reports that she no longer has GERD symptoms and is no longer taking Protonix   All immunizations and health maintenance protocols were reviewed with the patient and needed orders were placed. UTD  Appropriate screening laboratory values were ordered for the patient including screening of hyperlipidemia, renal function and hepatic function.   Medication reconciliation,  past medical history, social history, problem list and allergies were reviewed in detail with the patient  Goals were established with regard to weight loss, exercise, and  diet in compliance with medications. She has been doing high intensity training and eating healthy. She feels better over all  Wt Readings from Last 3 Encounters:  02/08/19 174 lb (78.9 kg)  11/10/18 171 lb (77.6 kg)  11/06/18 164 lb (74.4 kg)    He is followed by GYN, Dr. Ouida Sills- due for PAP  Her biggest complaint today is intermittent neck and shoulder pain. She finds that this happens more when she is sleeping on soft pillows when she travels. No numbness or tingling in her extremities. No decreased ROM  Review of Systems  Constitutional: Negative.   HENT: Negative.   Eyes: Negative.   Respiratory: Negative.   Cardiovascular: Negative.   Gastrointestinal: Negative.   Endocrine: Negative.   Genitourinary: Negative.   Musculoskeletal: Positive for arthralgias.  Skin: Negative.   Allergic/Immunologic: Negative.   Neurological:  Negative.   Hematological: Negative.   Psychiatric/Behavioral: Negative.    Past Medical History:  Diagnosis Date  . Anemia   . Coat's disease   . Gestational diabetes   . Kidney infection 2011  . Vaginal Pap smear, abnormal     Social History   Socioeconomic History  . Marital status: Married    Spouse name: Not on file  . Number of children: Not on file  . Years of education: Not on file  . Highest education level: Not on file  Occupational History  . Not on file  Social Needs  . Financial resource strain: Not on file  . Food insecurity:    Worry: Not on file    Inability: Not on file  . Transportation needs:    Medical: Not on file    Non-medical: Not on file  Tobacco Use  . Smoking status: Never Smoker  . Smokeless tobacco: Never Used  Substance and Sexual Activity  . Alcohol use: No  . Drug use: No  . Sexual activity: Yes    Partners: Male    Birth control/protection: Injection  Lifestyle  . Physical activity:    Days per week: Not on file    Minutes per session: Not on file  . Stress: Not on file  Relationships  . Social connections:    Talks on phone: Not on file    Gets together: Not on file    Attends religious service: Not on file    Active member of club or organization: Not on file    Attends meetings of clubs or  organizations: Not on file    Relationship status: Not on file  . Intimate partner violence:    Fear of current or ex partner: Not on file    Emotionally abused: Not on file    Physically abused: Not on file    Forced sexual activity: Not on file  Other Topics Concern  . Not on file  Social History Narrative   She works at [employer]    Married for 5 years    Three child ( 58, 3 7 18  months)       She likes to run       Past Surgical History:  Procedure Laterality Date  . EYE SURGERY    . LEEP  2002    Family History  Problem Relation Age of Onset  . Diabetes Mother   . Arthritis Mother   . Hypertension Mother   . Diabetes  Maternal Grandmother   . Alcohol abuse Maternal Grandfather   . Dementia Maternal Grandfather   . Heart disease Maternal Grandfather   . Stroke Paternal Grandmother   . Breast cancer Paternal Grandmother   . Alcohol abuse Maternal Uncle     Allergies  Allergen Reactions  . Amoxicillin Hives  . Penicillin G Hives    Current Outpatient Medications on File Prior to Visit  Medication Sig Dispense Refill  . sertraline (ZOLOFT) 100 MG tablet Take 1 tablet by mouth at bedtime.  2   No current facility-administered medications on file prior to visit.     BP 100/74   Temp 98.3 F (36.8 C)   Ht 5\' 5"  (1.651 m) Comment: WITHOUT SHOES  Wt 174 lb (78.9 kg)   BMI 28.96 kg/m       Objective:   Physical Exam Vitals signs and nursing note reviewed.  Constitutional:      General: She is not in acute distress.    Appearance: She is well-developed.  HENT:     Head: Normocephalic and atraumatic.     Right Ear: Tympanic membrane, ear canal and external ear normal. There is no impacted cerumen.     Left Ear: Tympanic membrane, ear canal and external ear normal. There is no impacted cerumen.     Nose: Nose normal. No congestion or rhinorrhea.     Mouth/Throat:     Mouth: Mucous membranes are moist.     Pharynx: Oropharynx is clear. No oropharyngeal exudate.  Eyes:     General:        Right eye: No discharge.        Left eye: No discharge.     Extraocular Movements: Extraocular movements intact.     Conjunctiva/sclera: Conjunctivae normal.     Pupils: Pupils are equal, round, and reactive to light.  Neck:     Musculoskeletal: Normal range of motion and neck supple. No neck rigidity or muscular tenderness.     Thyroid: No thyromegaly.     Vascular: No carotid bruit.     Trachea: No tracheal deviation.  Cardiovascular:     Rate and Rhythm: Normal rate and regular rhythm.     Heart sounds: Normal heart sounds. No murmur. No friction rub. No gallop.   Pulmonary:     Effort: Pulmonary  effort is normal. No respiratory distress.     Breath sounds: Normal breath sounds. No stridor. No wheezing, rhonchi or rales.  Chest:     Chest wall: No tenderness.  Abdominal:     General: Bowel sounds are normal. There is no  distension.     Palpations: Abdomen is soft. There is no mass.     Tenderness: There is no abdominal tenderness. There is no right CVA tenderness, left CVA tenderness, guarding or rebound.     Hernia: No hernia is present.  Musculoskeletal: Normal range of motion.        General: No swelling, tenderness, deformity or signs of injury.     Right lower leg: No edema.     Left lower leg: No edema.  Lymphadenopathy:     Cervical: No cervical adenopathy.  Skin:    General: Skin is warm and dry.     Capillary Refill: Capillary refill takes less than 2 seconds.     Coloration: Skin is not jaundiced or pale.     Findings: No bruising, erythema, lesion or rash.  Neurological:     Mental Status: She is alert and oriented to person, place, and time.     Cranial Nerves: No cranial nerve deficit.     Coordination: Coordination normal.  Psychiatric:        Mood and Affect: Mood normal.        Behavior: Behavior normal.        Thought Content: Thought content normal.        Judgment: Judgment normal.       Assessment & Plan:  1. Routine general medical examination at a health care facility - Benign exam  - Continue to exercise and eat healthy  - Follow up with GYN for PAP - Follow up with me in one year or sooner if needed - CBC with Differential/Platelet - Comprehensive metabolic panel - Lipid panel - TSH  2. Neck pain - likely due to spinal curvature with soft pillows. Will check xray of cervical spine  - DG Cervical Spine Complete; Future  Dorothyann Peng, NP

## 2019-06-13 DIAGNOSIS — Z760 Encounter for issue of repeat prescription: Secondary | ICD-10-CM | POA: Diagnosis not present

## 2019-08-04 DIAGNOSIS — Z20828 Contact with and (suspected) exposure to other viral communicable diseases: Secondary | ICD-10-CM | POA: Diagnosis not present

## 2019-10-16 ENCOUNTER — Encounter (INDEPENDENT_AMBULATORY_CARE_PROVIDER_SITE_OTHER): Payer: BLUE CROSS/BLUE SHIELD | Admitting: Ophthalmology

## 2020-08-14 DIAGNOSIS — Z20822 Contact with and (suspected) exposure to covid-19: Secondary | ICD-10-CM | POA: Diagnosis not present

## 2020-08-19 DIAGNOSIS — Z139 Encounter for screening, unspecified: Secondary | ICD-10-CM | POA: Diagnosis not present

## 2020-08-19 DIAGNOSIS — Z0000 Encounter for general adult medical examination without abnormal findings: Secondary | ICD-10-CM | POA: Diagnosis not present

## 2020-08-19 DIAGNOSIS — Z131 Encounter for screening for diabetes mellitus: Secondary | ICD-10-CM | POA: Diagnosis not present

## 2020-08-19 DIAGNOSIS — Z1329 Encounter for screening for other suspected endocrine disorder: Secondary | ICD-10-CM | POA: Diagnosis not present

## 2020-08-19 DIAGNOSIS — Z13220 Encounter for screening for lipoid disorders: Secondary | ICD-10-CM | POA: Diagnosis not present

## 2020-08-19 DIAGNOSIS — E569 Vitamin deficiency, unspecified: Secondary | ICD-10-CM | POA: Diagnosis not present

## 2020-09-10 DIAGNOSIS — E559 Vitamin D deficiency, unspecified: Secondary | ICD-10-CM | POA: Diagnosis not present

## 2020-09-10 DIAGNOSIS — F329 Major depressive disorder, single episode, unspecified: Secondary | ICD-10-CM | POA: Diagnosis not present

## 2020-09-10 DIAGNOSIS — E668 Other obesity: Secondary | ICD-10-CM | POA: Diagnosis not present

## 2020-09-10 DIAGNOSIS — E785 Hyperlipidemia, unspecified: Secondary | ICD-10-CM | POA: Diagnosis not present

## 2020-09-19 IMAGING — CT CT ANGIO CHEST
2 of 7 series · 19 of 46 positions shown · IV contrast (iopamidol)
Comparison: Chest radiographs dated 11/06/2018

CLINICAL DATA: Left chest pain x3 days, elevated D-dimer

EXAM:
CT ANGIOGRAPHY CHEST WITH CONTRAST
TECHNIQUE: Multidetector CT imaging of the chest was performed using the
standard protocol during bolus administration of intravenous
contrast. Multiplanar CT image reconstructions and MIPs were
obtained to evaluate the vascular anatomy.
CONTRAST:  100mL [id number] IOPAMIDOL ([id number]) INJECTION 76%

[Series 7: thins · axial · 0.59mm/px · z∈[+1205,+1445]mm · 16 of 387 slices shown]
[im 22/387  lung]
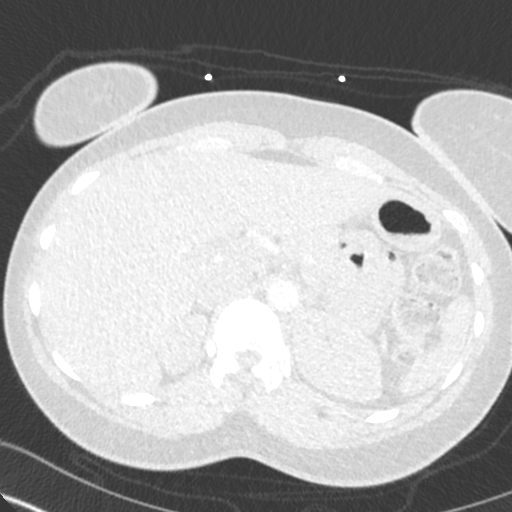
[im 43/387  soft-tissue]
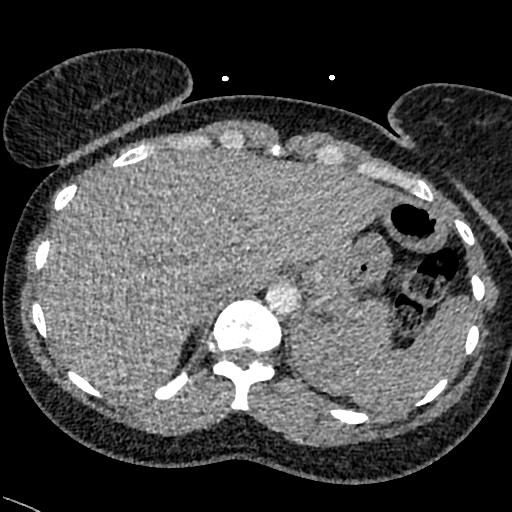
[im 65/387  lung]
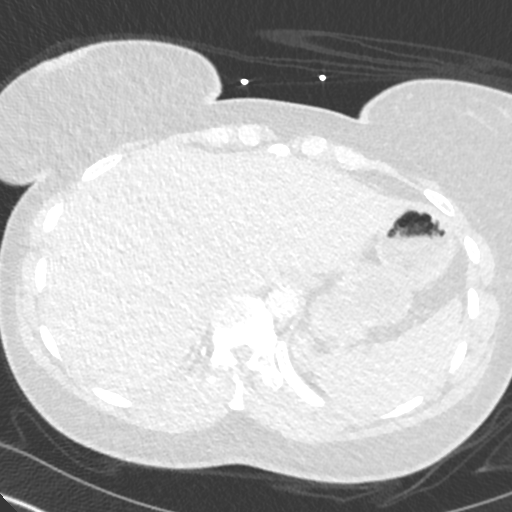
[im 86/387  soft-tissue]
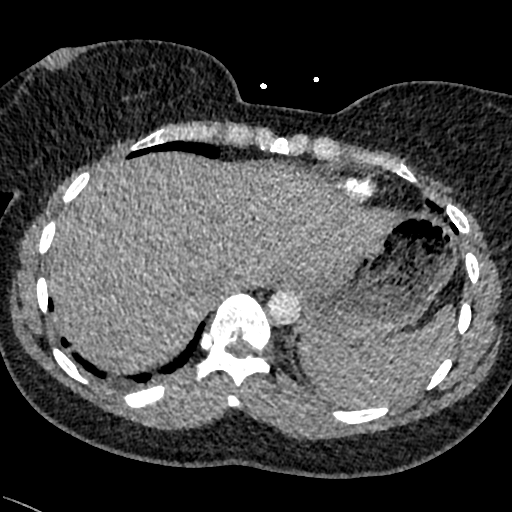
[im 108/387  lung]
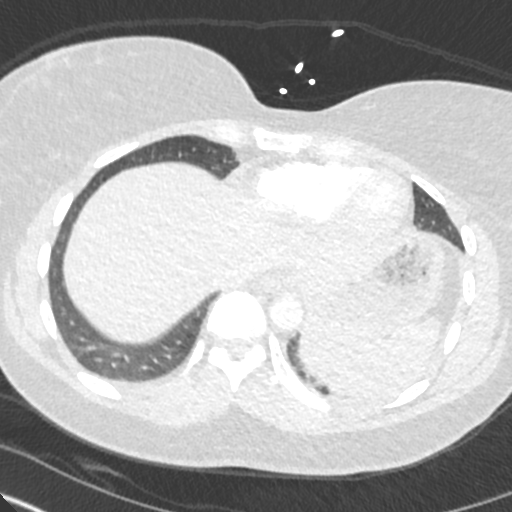
[im 129/387  soft-tissue]
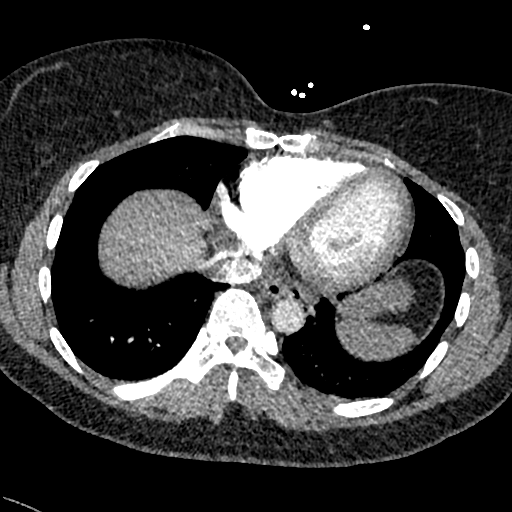
[im 151/387  lung]
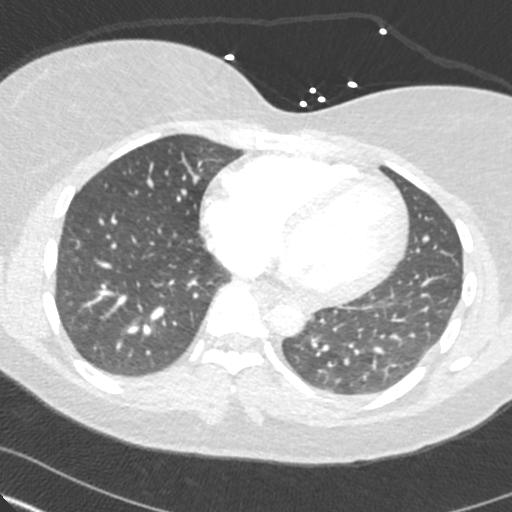
[im 172/387  soft-tissue]
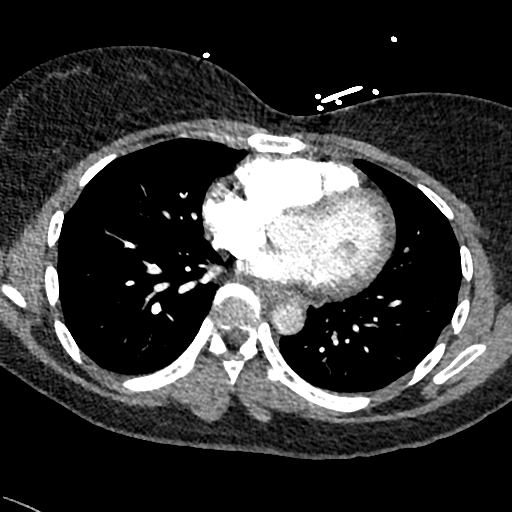
[im 215/387  lung]
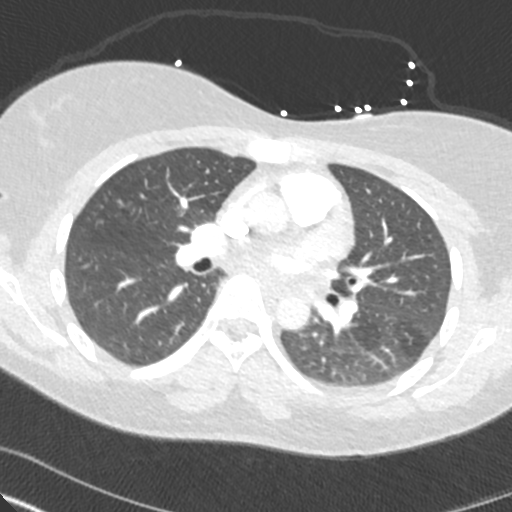
[im 236/387  soft-tissue]
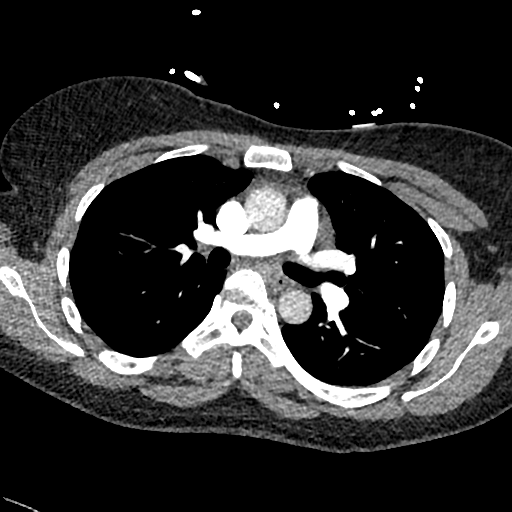
[im 258/387  lung]
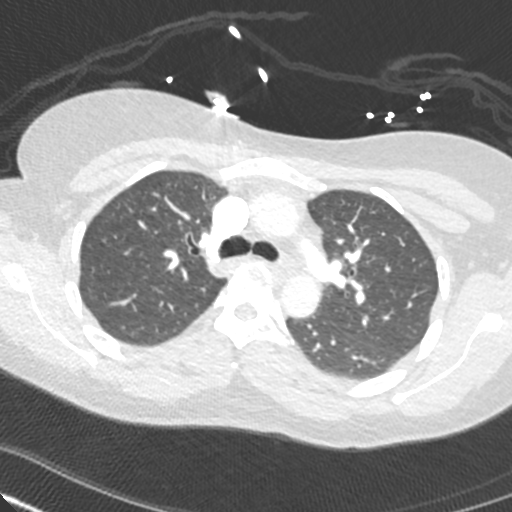
[im 279/387  soft-tissue]
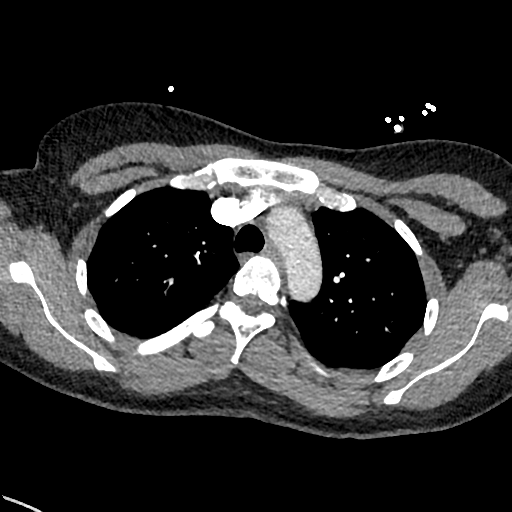
[im 301/387  lung]
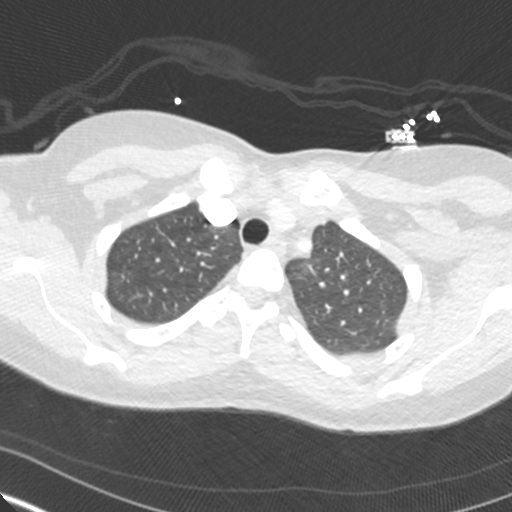
[im 322/387  soft-tissue]
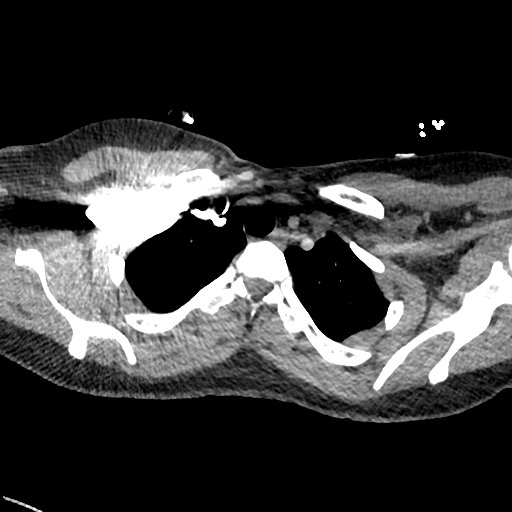
[im 344/387  lung]
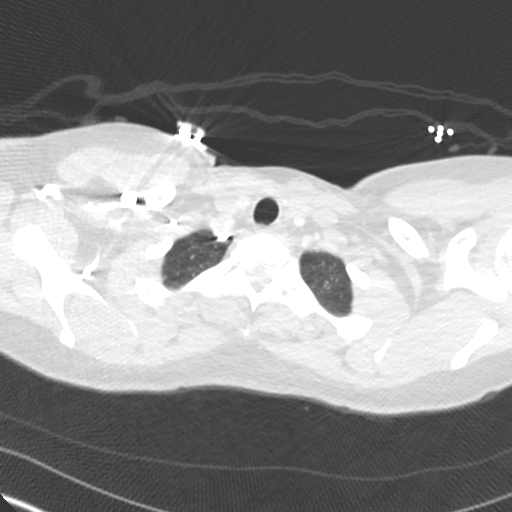
[im 365/387  soft-tissue]
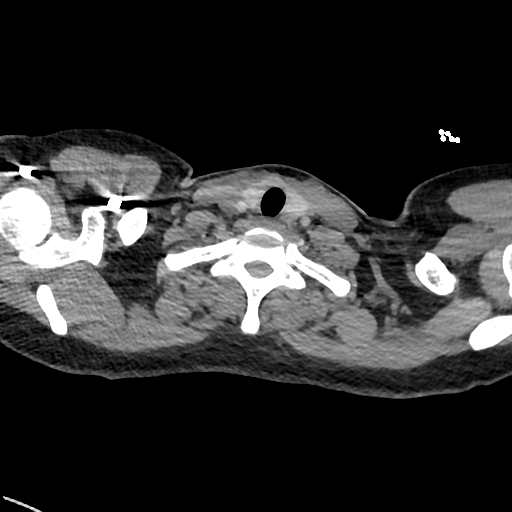

[Series 8: cor · coronal · 0.49mm/px · 3 of 114 slices shown]
[im 29/114  soft-tissue]
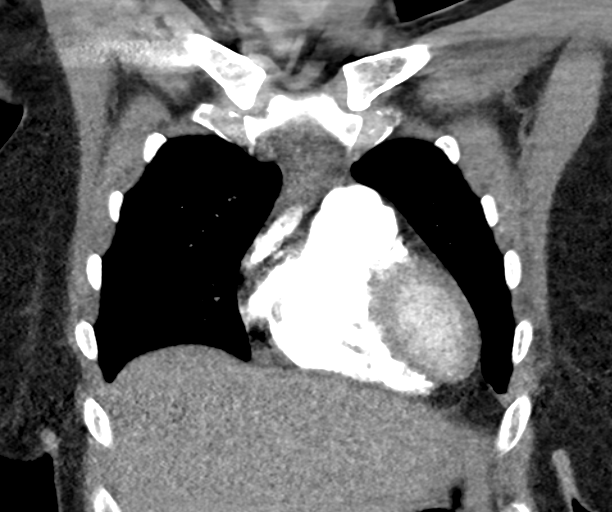
[im 57/114  soft-tissue]
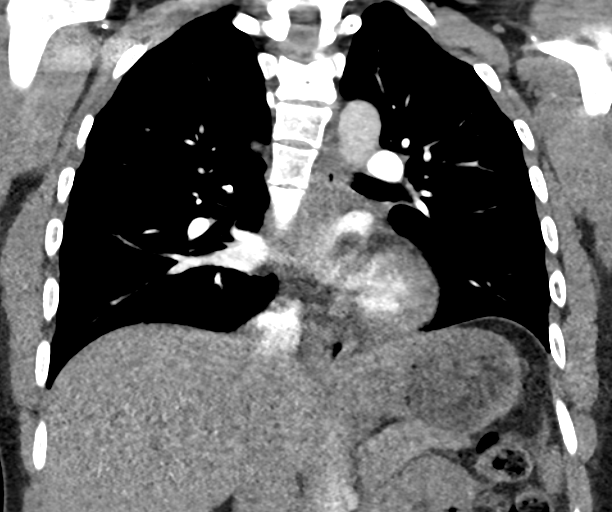
[im 85/114  soft-tissue]
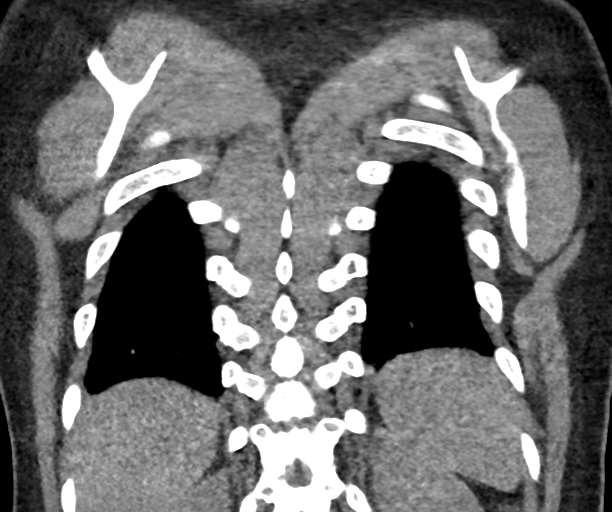

[19 of 46 positions shown; findings below may reference images not displayed]

FINDINGS: Cardiovascular: Satisfactory opacification of the pulmonary arteries
to the segmental level. No evidence of pulmonary embolism.

No evidence of thoracic aortic aneurysm.

The heart is normal in size.

Mediastinum/Nodes: No suspicious mediastinal lymphadenopathy.

Visualized thyroid is unremarkable.

Lungs/Pleura: Lungs are clear.

No suspicious pulmonary nodules.

No focal consolidation.

No pleural effusion or pneumothorax.

Upper Abdomen: Visualized upper abdomen is unremarkable.

Musculoskeletal: Visualized osseous structures are within normal
limits.

Review of the MIP images confirms the above findings.
IMPRESSION: No evidence of pulmonary embolism.

Normal CT chest.

## 2020-09-23 DIAGNOSIS — R937 Abnormal findings on diagnostic imaging of other parts of musculoskeletal system: Secondary | ICD-10-CM | POA: Diagnosis not present

## 2020-09-23 DIAGNOSIS — I10 Essential (primary) hypertension: Secondary | ICD-10-CM | POA: Diagnosis not present

## 2020-09-23 DIAGNOSIS — R202 Paresthesia of skin: Secondary | ICD-10-CM | POA: Diagnosis not present

## 2020-09-23 DIAGNOSIS — M25512 Pain in left shoulder: Secondary | ICD-10-CM | POA: Diagnosis not present

## 2020-09-23 DIAGNOSIS — S46912A Strain of unspecified muscle, fascia and tendon at shoulder and upper arm level, left arm, initial encounter: Secondary | ICD-10-CM | POA: Diagnosis not present

## 2020-09-23 DIAGNOSIS — X58XXXA Exposure to other specified factors, initial encounter: Secondary | ICD-10-CM | POA: Diagnosis not present

## 2020-09-24 DIAGNOSIS — M25512 Pain in left shoulder: Secondary | ICD-10-CM | POA: Diagnosis not present

## 2020-09-24 DIAGNOSIS — M7502 Adhesive capsulitis of left shoulder: Secondary | ICD-10-CM | POA: Diagnosis not present

## 2020-10-07 DIAGNOSIS — R293 Abnormal posture: Secondary | ICD-10-CM | POA: Diagnosis not present

## 2020-10-07 DIAGNOSIS — M25512 Pain in left shoulder: Secondary | ICD-10-CM | POA: Diagnosis not present

## 2020-10-07 DIAGNOSIS — M6281 Muscle weakness (generalized): Secondary | ICD-10-CM | POA: Diagnosis not present

## 2020-10-07 DIAGNOSIS — M25612 Stiffness of left shoulder, not elsewhere classified: Secondary | ICD-10-CM | POA: Diagnosis not present

## 2020-10-10 DIAGNOSIS — M7501 Adhesive capsulitis of right shoulder: Secondary | ICD-10-CM | POA: Diagnosis not present

## 2020-10-11 DIAGNOSIS — Z20822 Contact with and (suspected) exposure to covid-19: Secondary | ICD-10-CM | POA: Diagnosis not present

## 2020-10-16 DIAGNOSIS — M25612 Stiffness of left shoulder, not elsewhere classified: Secondary | ICD-10-CM | POA: Diagnosis not present

## 2020-10-16 DIAGNOSIS — M6281 Muscle weakness (generalized): Secondary | ICD-10-CM | POA: Diagnosis not present

## 2020-10-16 DIAGNOSIS — R293 Abnormal posture: Secondary | ICD-10-CM | POA: Diagnosis not present

## 2020-10-16 DIAGNOSIS — M25512 Pain in left shoulder: Secondary | ICD-10-CM | POA: Diagnosis not present

## 2020-10-22 DIAGNOSIS — R293 Abnormal posture: Secondary | ICD-10-CM | POA: Diagnosis not present

## 2020-10-22 DIAGNOSIS — M6281 Muscle weakness (generalized): Secondary | ICD-10-CM | POA: Diagnosis not present

## 2020-10-22 DIAGNOSIS — M25512 Pain in left shoulder: Secondary | ICD-10-CM | POA: Diagnosis not present

## 2020-10-22 DIAGNOSIS — M25612 Stiffness of left shoulder, not elsewhere classified: Secondary | ICD-10-CM | POA: Diagnosis not present

## 2020-10-28 DIAGNOSIS — M79671 Pain in right foot: Secondary | ICD-10-CM | POA: Diagnosis not present

## 2020-10-28 DIAGNOSIS — F418 Other specified anxiety disorders: Secondary | ICD-10-CM | POA: Diagnosis not present

## 2020-10-28 DIAGNOSIS — E559 Vitamin D deficiency, unspecified: Secondary | ICD-10-CM | POA: Diagnosis not present

## 2020-11-01 DIAGNOSIS — M6281 Muscle weakness (generalized): Secondary | ICD-10-CM | POA: Diagnosis not present

## 2020-11-01 DIAGNOSIS — M25612 Stiffness of left shoulder, not elsewhere classified: Secondary | ICD-10-CM | POA: Diagnosis not present

## 2020-11-01 DIAGNOSIS — M25512 Pain in left shoulder: Secondary | ICD-10-CM | POA: Diagnosis not present

## 2020-11-01 DIAGNOSIS — R293 Abnormal posture: Secondary | ICD-10-CM | POA: Diagnosis not present

## 2020-11-07 DIAGNOSIS — M7502 Adhesive capsulitis of left shoulder: Secondary | ICD-10-CM | POA: Diagnosis not present

## 2020-11-07 DIAGNOSIS — M7501 Adhesive capsulitis of right shoulder: Secondary | ICD-10-CM | POA: Diagnosis not present

## 2020-11-14 DIAGNOSIS — M542 Cervicalgia: Secondary | ICD-10-CM | POA: Diagnosis not present

## 2020-11-14 DIAGNOSIS — M47812 Spondylosis without myelopathy or radiculopathy, cervical region: Secondary | ICD-10-CM | POA: Diagnosis not present

## 2020-11-14 DIAGNOSIS — M5412 Radiculopathy, cervical region: Secondary | ICD-10-CM | POA: Diagnosis not present

## 2020-12-18 DIAGNOSIS — Z20822 Contact with and (suspected) exposure to covid-19: Secondary | ICD-10-CM | POA: Diagnosis not present

## 2021-04-16 DIAGNOSIS — M25512 Pain in left shoulder: Secondary | ICD-10-CM | POA: Diagnosis not present

## 2021-05-01 DIAGNOSIS — M67813 Other specified disorders of tendon, right shoulder: Secondary | ICD-10-CM | POA: Diagnosis not present

## 2021-05-01 DIAGNOSIS — M25512 Pain in left shoulder: Secondary | ICD-10-CM | POA: Diagnosis not present

## 2021-05-01 DIAGNOSIS — M7582 Other shoulder lesions, left shoulder: Secondary | ICD-10-CM | POA: Diagnosis not present

## 2021-05-01 DIAGNOSIS — M19012 Primary osteoarthritis, left shoulder: Secondary | ICD-10-CM | POA: Diagnosis not present

## 2021-05-02 DIAGNOSIS — M7552 Bursitis of left shoulder: Secondary | ICD-10-CM | POA: Diagnosis not present

## 2021-05-02 DIAGNOSIS — M25512 Pain in left shoulder: Secondary | ICD-10-CM | POA: Diagnosis not present

## 2021-05-02 DIAGNOSIS — M7542 Impingement syndrome of left shoulder: Secondary | ICD-10-CM | POA: Diagnosis not present

## 2021-05-02 DIAGNOSIS — H3509 Other intraretinal microvascular abnormalities: Secondary | ICD-10-CM | POA: Diagnosis not present

## 2021-05-02 DIAGNOSIS — H5213 Myopia, bilateral: Secondary | ICD-10-CM | POA: Diagnosis not present
